# Patient Record
Sex: Male | Born: 1980 | Race: White | Smoking: Never smoker
Health system: Southern US, Community
[De-identification: ages and names within clinical notes are randomized; demographics above are authoritative.]

## PROBLEM LIST (undated history)

## (undated) DIAGNOSIS — T7840XA Allergy, unspecified, initial encounter: Secondary | ICD-10-CM

## (undated) DIAGNOSIS — J349 Unspecified disorder of nose and nasal sinuses: Secondary | ICD-10-CM

## (undated) DIAGNOSIS — I10 Essential (primary) hypertension: Secondary | ICD-10-CM

## (undated) HISTORY — DX: Unspecified disorder of nose and nasal sinuses: J34.9

## (undated) HISTORY — PX: TONSILLECTOMY: SUR1361

## (undated) HISTORY — PX: TYMPANOSTOMY TUBE PLACEMENT: SHX32

## (undated) HISTORY — DX: Allergy, unspecified, initial encounter: T78.40XA

## (undated) HISTORY — DX: Essential (primary) hypertension: I10

## (undated) HISTORY — PX: WISDOM TOOTH EXTRACTION: SHX21

---

## 2018-02-10 ENCOUNTER — Observation Stay
Admission: EM | Admit: 2018-02-10 | Discharge: 2018-02-12 | Disposition: A | Discharge status: Home / Self Care | Payer: 59 | Attending: Internal Medicine | Admitting: Internal Medicine

## 2018-02-10 ENCOUNTER — Encounter: Payer: Self-pay | Admitting: Physician Assistant

## 2018-02-10 ENCOUNTER — Observation Stay: Payer: 59

## 2018-02-10 ENCOUNTER — Encounter: Payer: Self-pay | Admitting: Emergency Medicine

## 2018-02-10 ENCOUNTER — Telehealth: Payer: Self-pay | Admitting: Physician Assistant

## 2018-02-10 ENCOUNTER — Ambulatory Visit (INDEPENDENT_AMBULATORY_CARE_PROVIDER_SITE_OTHER): Payer: PRIVATE HEALTH INSURANCE | Admitting: Physician Assistant

## 2018-02-10 VITALS — BP 220/140 | HR 113 | Temp 98.5°F | Ht 72.0 in | Wt 251.8 lb

## 2018-02-10 DIAGNOSIS — J309 Allergic rhinitis, unspecified: Secondary | ICD-10-CM | POA: Diagnosis not present

## 2018-02-10 DIAGNOSIS — I10 Essential (primary) hypertension: Secondary | ICD-10-CM | POA: Diagnosis not present

## 2018-02-10 DIAGNOSIS — Z6834 Body mass index (BMI) 34.0-34.9, adult: Secondary | ICD-10-CM | POA: Diagnosis not present

## 2018-02-10 DIAGNOSIS — E876 Hypokalemia: Secondary | ICD-10-CM | POA: Insufficient documentation

## 2018-02-10 DIAGNOSIS — I161 Hypertensive emergency: Secondary | ICD-10-CM

## 2018-02-10 DIAGNOSIS — R9431 Abnormal electrocardiogram [ECG] [EKG]: Secondary | ICD-10-CM | POA: Insufficient documentation

## 2018-02-10 DIAGNOSIS — I160 Hypertensive urgency: Secondary | ICD-10-CM | POA: Diagnosis not present

## 2018-02-10 DIAGNOSIS — D72829 Elevated white blood cell count, unspecified: Secondary | ICD-10-CM | POA: Insufficient documentation

## 2018-02-10 DIAGNOSIS — Z7982 Long term (current) use of aspirin: Secondary | ICD-10-CM | POA: Diagnosis not present

## 2018-02-10 DIAGNOSIS — R000 Tachycardia, unspecified: Secondary | ICD-10-CM | POA: Diagnosis not present

## 2018-02-10 DIAGNOSIS — E669 Obesity, unspecified: Secondary | ICD-10-CM | POA: Diagnosis not present

## 2018-02-10 DIAGNOSIS — Z79899 Other long term (current) drug therapy: Secondary | ICD-10-CM | POA: Insufficient documentation

## 2018-02-10 DIAGNOSIS — J329 Chronic sinusitis, unspecified: Secondary | ICD-10-CM | POA: Diagnosis present

## 2018-02-10 LAB — CBC
HEMATOCRIT: 49.1 % (ref 40.0–52.0)
HEMOGLOBIN: 16.7 g/dL (ref 13.0–18.0)
MCH: 27.3 pg (ref 26.0–34.0)
MCHC: 33.9 g/dL (ref 32.0–36.0)
MCV: 80.5 fL (ref 80.0–100.0)
Platelets: 330 10*3/uL (ref 150–440)
RBC: 6.1 MIL/uL — AB (ref 4.40–5.90)
RDW: 14.8 % — ABNORMAL HIGH (ref 11.5–14.5)
WBC: 15.1 10*3/uL — ABNORMAL HIGH (ref 3.8–10.6)

## 2018-02-10 LAB — BASIC METABOLIC PANEL
ANION GAP: 11 (ref 5–15)
BUN: 12 mg/dL (ref 6–20)
CHLORIDE: 103 mmol/L (ref 101–111)
CO2: 25 mmol/L (ref 22–32)
Calcium: 9.3 mg/dL (ref 8.9–10.3)
Creatinine, Ser: 1.11 mg/dL (ref 0.61–1.24)
GFR calc non Af Amer: >60 mL/min (ref >60)
Glucose, Bld: 125 mg/dL — ABNORMAL HIGH (ref 65–99)
Potassium: 3.2 mmol/L — ABNORMAL LOW (ref 3.5–5.1)
Sodium: 139 mmol/L (ref 135–145)

## 2018-02-10 LAB — TROPONIN I: Troponin I: <0.03 ng/mL (ref <0.03)

## 2018-02-10 MED ORDER — SODIUM CHLORIDE 0.9% FLUSH: 3.0000 mL | INTRAVENOUS | Status: DC | PRN

## 2018-02-10 MED ORDER — CLONIDINE HCL 0.1 MG PO TABS
0.1000 mg | ORAL_TABLET | ORAL | Status: AC
  Administered 2018-02-10: 0.1 mg via ORAL

## 2018-02-10 MED ORDER — ONDANSETRON HCL 4 MG/2ML IJ SOLN: 4.0000 mg | Freq: Four times a day (QID) | INTRAMUSCULAR | Status: DC | PRN

## 2018-02-10 MED ORDER — SALINE SPRAY 0.65 % NA SOLN
1.0000 | NASAL | Status: DC | PRN
  Filled 2018-02-10: qty 44

## 2018-02-10 MED ORDER — LABETALOL HCL 5 MG/ML IV SOLN
10.0000 mg | Freq: Once | INTRAVENOUS | Status: AC
  Administered 2018-02-10: 10 mg via INTRAVENOUS
  Filled 2018-02-10: qty 4

## 2018-02-10 MED ORDER — ATENOLOL 50 MG PO TABS
50.0000 mg | ORAL_TABLET | Freq: Every day | ORAL | Status: DC
  Administered 2018-02-11: 50 mg via ORAL
  Filled 2018-02-10 (×2): qty 1

## 2018-02-10 MED ORDER — POTASSIUM CHLORIDE CRYS ER 20 MEQ PO TBCR
40.0000 meq | EXTENDED_RELEASE_TABLET | Freq: Once | ORAL | Status: AC
  Administered 2018-02-10: 40 meq via ORAL
  Filled 2018-02-10: qty 2

## 2018-02-10 MED ORDER — ASPIRIN EC 81 MG PO TBEC
81.0000 mg | DELAYED_RELEASE_TABLET | Freq: Every day | ORAL | Status: DC
  Administered 2018-02-11 – 2018-02-12 (×2): 81 mg via ORAL
  Filled 2018-02-10 (×2): qty 1

## 2018-02-10 MED ORDER — ACETAMINOPHEN 650 MG RE SUPP: 650.0000 mg | Freq: Four times a day (QID) | RECTAL | Status: DC | PRN

## 2018-02-10 MED ORDER — CLONIDINE HCL 0.1 MG PO TABS
ORAL_TABLET | ORAL | Status: AC
  Administered 2018-02-10: 0.1 mg via ORAL
  Filled 2018-02-10: qty 1

## 2018-02-10 MED ORDER — ONDANSETRON HCL 4 MG PO TABS: 4.0000 mg | ORAL_TABLET | Freq: Four times a day (QID) | ORAL | Status: DC | PRN

## 2018-02-10 MED ORDER — HYDRALAZINE HCL 20 MG/ML IJ SOLN
10.0000 mg | INTRAMUSCULAR | Status: DC | PRN
  Administered 2018-02-10: 10 mg via INTRAVENOUS
  Filled 2018-02-10: qty 1

## 2018-02-10 MED ORDER — NITROGLYCERIN 0.4 MG SL SUBL: 0.4000 mg | SUBLINGUAL_TABLET | SUBLINGUAL | Status: DC | PRN

## 2018-02-10 MED ORDER — LABETALOL HCL 100 MG PO TABS
100.0000 mg | ORAL_TABLET | Freq: Once | ORAL | Status: AC
  Administered 2018-02-10: 100 mg via ORAL
  Filled 2018-02-10: qty 1

## 2018-02-10 MED ORDER — LORATADINE 10 MG PO TABS
10.0000 mg | ORAL_TABLET | Freq: Every day | ORAL | Status: DC
  Administered 2018-02-10 – 2018-02-11 (×2): 10 mg via ORAL
  Filled 2018-02-10 (×2): qty 1

## 2018-02-10 MED ORDER — FLUTICASONE PROPIONATE 50 MCG/ACT NA SUSP
2.0000 | Freq: Every day | NASAL | Status: DC
  Administered 2018-02-11 – 2018-02-12 (×2): 2 via NASAL
  Filled 2018-02-10: qty 16

## 2018-02-10 MED ORDER — SODIUM CHLORIDE 0.9% FLUSH
3.0000 mL | Freq: Two times a day (BID) | INTRAVENOUS | Status: DC
  Administered 2018-02-10 – 2018-02-12 (×4): 3 mL via INTRAVENOUS

## 2018-02-10 MED ORDER — MORPHINE SULFATE (PF) 2 MG/ML IV SOLN: 2.0000 mg | INTRAVENOUS | Status: DC | PRN

## 2018-02-10 MED ORDER — CLONIDINE HCL 0.1 MG PO TABS
0.1000 mg | ORAL_TABLET | ORAL | Status: AC
  Administered 2018-02-10: 0.1 mg via ORAL
  Filled 2018-02-10: qty 1

## 2018-02-10 MED ORDER — LISINOPRIL 20 MG PO TABS
20.0000 mg | ORAL_TABLET | Freq: Two times a day (BID) | ORAL | Status: DC
  Administered 2018-02-10 – 2018-02-12 (×4): 20 mg via ORAL
  Filled 2018-02-10 (×4): qty 1

## 2018-02-10 MED ORDER — HYDROCHLOROTHIAZIDE 25 MG PO TABS
25.0000 mg | ORAL_TABLET | Freq: Every day | ORAL | Status: DC
Start: 2018-02-10 — End: 2018-02-12
  Administered 2018-02-10 – 2018-02-12 (×3): 25 mg via ORAL
  Filled 2018-02-10 (×3): qty 1

## 2018-02-10 MED ORDER — SODIUM CHLORIDE 0.9 % IV SOLN: 250.0000 mL | INTRAVENOUS | Status: DC | PRN

## 2018-02-10 MED ORDER — ACETAMINOPHEN 325 MG PO TABS: 650.0000 mg | ORAL_TABLET | Freq: Four times a day (QID) | ORAL | Status: DC | PRN

## 2018-02-10 NOTE — H&P (Signed)
[facility] [place] [facility]   PATIENT NAME: Michael BjorkBryant Carr    MR#:  [id number]  DATE OF BIRTH:  05/02/1981  DATE OF ADMISSION:  02/10/2018  PRIMARY CARE PHYSICIAN: Trey SailorsPollak, Adriana M, PA-C   REQUESTING/REFERRING PHYSICIAN:   CHIEF COMPLAINT:   Chief Complaint  Patient presents with  . Hypertension    HISTORY OF PRESENT ILLNESS: Michael BjorkBryant Carr  is a 37 y.o. male with a known history per below, sent to the emergency room for abnormal EKG findings on routine follow-up for allergic rhinitis in the outpatient setting, patient was also noted to have systolic blood pressure greater than 240, in the emergency room patient was noted to have sinus tachycardia with heart rate up to 110, blood pressure 240/157, EKG noted for ST depression with T wave inversion laterally and possible anterior infarct of undetermined age, potassium 3.2, white count 15,000, patient evaluated in the emergency room, given clonidine without improvement, no apparent distress, patient denies feeling unwell, patient is now been admitted for acute hypertensive crisis with abnormal EKG findings.  PAST MEDICAL HISTORY:   Past Medical History:  Diagnosis Date  . Allergy   . Hypertension    previously per patient. Does not take any medications    PAST SURGICAL HISTORY:  Past Surgical History:  Procedure Laterality Date  . TONSILLECTOMY    . TYMPANOSTOMY TUBE PLACEMENT    . WISDOM TOOTH EXTRACTION      SOCIAL HISTORY:  Social History   Tobacco Use  . Smoking status: Never Smoker  . Smokeless tobacco: Never Used  Substance Use Topics  . Alcohol use: Yes    Comment: socially    FAMILY HISTORY:  Family History  Problem Relation Age of Onset  . Heart disease Mother   . Lung cancer Father   . Hemachromatosis Father   . Drug abuse Brother   . Alcohol abuse Brother   . Heart attack Brother   . Epilepsy Brother   . Cancer Maternal Grandfather   . Alzheimer's disease Maternal Grandfather   .  Hemachromatosis Paternal Grandfather   . Liver disease Paternal Grandfather     DRUG ALLERGIES: No Known Allergies  REVIEW OF SYSTEMS:   CONSTITUTIONAL: No fever, fatigue or weakness.  EYES: No blurred or double vision.  EARS, NOSE, AND THROAT: No tinnitus or ear pain. + Chronic allergies RESPIRATORY: No cough, shortness of breath, wheezing or hemoptysis.  CARDIOVASCULAR: No chest pain, orthopnea, edema.  GASTROINTESTINAL: No nausea, vomiting, diarrhea or abdominal pain.  GENITOURINARY: No dysuria, hematuria.  ENDOCRINE: No polyuria, nocturia,  HEMATOLOGY: No anemia, easy bruising or bleeding SKIN: No rash or lesion. MUSCULOSKELETAL: No joint pain or arthritis.   NEUROLOGIC: No tingling, numbness, weakness.  PSYCHIATRY: No anxiety or depression.   MEDICATIONS AT HOME:  Prior to Admission medications   Not on File      PHYSICAL EXAMINATION:   VITAL SIGNS: Blood pressure (!) 241/145, pulse 99, temperature 98.3 F (36.8 C), temperature source Oral, resp. rate 18, height 6' (1.829 m), weight 113.9 kg (251 lb), SpO2 99 %.  GENERAL:  37 y.o.-year-old patient lying in the bed with no acute distress.  Obese EYES: Pupils equal, round, reactive to light and accommodation. No scleral icterus. Extraocular muscles intact.  HEENT: Head atraumatic, normocephalic. Oropharynx and nasopharynx clear.  NECK:  Supple, no jugular venous distention. No thyroid enlargement, no tenderness.  LUNGS: Normal breath sounds bilaterally, no wheezing, rales,rhonchi or crepitation. No use of accessory muscles of respiration.  CARDIOVASCULAR:  S1, S2 normal. No murmurs, rubs, or gallops.  ABDOMEN: Soft, nontender, nondistended. Bowel sounds present. No organomegaly or mass.  EXTREMITIES: No pedal edema, cyanosis, or clubbing.  NEUROLOGIC: Cranial nerves II through XII are intact. Muscle strength 5/5 in all extremities. Sensation intact. Gait not checked.  PSYCHIATRIC: The patient is alert and oriented x 3.   SKIN: No obvious rash, lesion, or ulcer.   LABORATORY PANEL:   CBC Recent Labs  Lab 02/10/18 1258  WBC 15.1*  HGB 16.7  HCT 49.1  PLT 330  MCV 80.5  MCH 27.3  MCHC 33.9  RDW 14.8*   ------------------------------------------------------------------------------------------------------------------  Chemistries  Recent Labs  Lab 02/10/18 1258  NA 139  K 3.2*  CL 103  CO2 25  GLUCOSE 125*  BUN 12  CREATININE 1.11  CALCIUM 9.3   ------------------------------------------------------------------------------------------------------------------ estimated creatinine clearance is 118.7 mL/min (by C-G formula based on SCr of 1.11 mg/dL). ------------------------------------------------------------------------------------------------------------------ No results for input(s): TSH, T4TOTAL, T3FREE, THYROIDAB in the last 72 hours.  Invalid input(s): FREET3   Coagulation profile No results for input(s): INR, PROTIME in the last 168 hours. ------------------------------------------------------------------------------------------------------------------- No results for input(s): DDIMER in the last 72 hours. -------------------------------------------------------------------------------------------------------------------  Cardiac Enzymes Recent Labs  Lab 02/10/18 1258  TROPONINI <0.03   ------------------------------------------------------------------------------------------------------------------ Invalid input(s): POCBNP  ---------------------------------------------------------------------------------------------------------------  Urinalysis No results found for: COLORURINE, APPEARANCEUR, LABSPEC, PHURINE, GLUCOSEU, HGBUR, BILIRUBINUR, KETONESUR, PROTEINUR, UROBILINOGEN, NITRITE, LEUKOCYTESUR   RADIOLOGY: No results found.  EKG: Orders placed or performed during the hospital encounter of 02/10/18  . ED EKG  . ED EKG    IMPRESSION AND PLAN: *Acute  hypertensive crisis Referred to the observation unit on the telemetry floor, start atenolol daily, lisinopril twice daily, hydrochlorothiazide, hydralazine as needed systolic blood pressure greater than 160, vitals per routine, make changes as per necessary  *Acute abnormal EKG findings Without signs or symptoms of ACS Noted for ST segment depression with T wave inversions laterally, ? anterior infarct of undetermined age Cardiology to see, aspirin daily, nitrates as needed, IV morphine PRN severe pain, supplemental oxygen, check echocardiogram, continue close medical monitoring  *Acute hypokalemia Check magnesium level and replete with p.o. Potassium Check BMP in the morning  *Chronic allergic rhinitis Flonase daily, claritin, nasal saline  *Acute sinus tachycardia Etiology is unknown Noted leukocytosis Check chest x-ray, urine drug screen, urinalysis  *Acute leukocytosis Exact etiology unknown ?  Secondary to sinus disease Avoid antibiotics for now, routine allergy medicines per above, repeat CBC in the morning  *Chronic obesity Most likely secondary to excess calories Lifestyle modification recommended  All the records are reviewed and case discussed with ED provider. Management plans discussed with the patient, family and they are in agreement.  CODE STATUS:full    TOTAL TIME TAKING CARE OF THIS PATIENT: 45 minutes.    Evelena Asa Salary M.D on 02/10/2018   Between 7am to 6pm - Pager - [phone]  After 6pm go to [email or web address] - password [email or web address] [facility]  [facility] [place] [facility]  Office  [phone]  CC: Primary care physician; Trey Sailors, PA-C   Note: This dictation was prepared with Dragon dictation along with smaller phrase technology. Any transcriptional errors that result from this process are unintentional.

## 2018-02-10 NOTE — Telephone Encounter (Signed)
Patient presents in clinic today as a new patient today. On arrival, his BP was 240/188. He reports feeling perfectly normal. He denies chest pain, SOB, blurred vision, headache. He initially does not want to go to the ER because of work appointments. On recheck his BP is 240/140 and patient remains asymptomatic. EKG does not show ST elevations but does show marked LVH and inverted T-waves in V1. At this point, patients blood pressure remains dangerously elevated and he has been instructed to go to the ER. Since he is not having chest pain or otherwise symptomatic, he has been advised that he can drive himself to [facility] and he MUST go to [facility] ER. He has promised to do that. I have called ahead and given report to Penni BombardKendall, Charity fundraiserN.

## 2018-02-10 NOTE — ED Provider Notes (Signed)
[facility] Emergency Department Provider Note ____________________________________________   First MD Initiated Contact with Patient 02/10/18 1530     (approximate)  I have reviewed the triage vital signs and the nursing notes.   HISTORY  Chief Complaint Hypertension   HPI Michael Carr is a 37 y.o. male presents for evaluation for elevated blood pressure  Patient reports that her primary care doctor clinic appointment today for a sinus infection which she gets off and on seasonally.  Reports are usually treated with azithromycin, but today notices blood pressure was over 200.  They referred him here for further treatment  Reports he had blood pressure problems 5 years ago in [place], he was on lisinopril at that time but is since discontinued after he had weight loss.  As in his blood pressure checked in several years he suspects  Denies chest pain no vision changes no headache.  Reports he would have had no idea his blood pressure was high for him checked to the clinic.  No fevers or chills.  No history of any aneurysm.  No known heart problems.   Past Medical History:  Diagnosis Date  . Allergy   . Hypertension    previously per patient. Does not take any medications    There are no active problems to display for this patient.   Past Surgical History:  Procedure Laterality Date  . TONSILLECTOMY    . TYMPANOSTOMY TUBE PLACEMENT    . WISDOM TOOTH EXTRACTION      Prior to Admission medications   Not on File    Allergies Patient has no known allergies.  Family History  Problem Relation Age of Onset  . Heart disease Mother   . Lung cancer Father   . Hemachromatosis Father   . Drug abuse Brother   . Alcohol abuse Brother   . Heart attack Brother   . Epilepsy Brother   . Cancer Maternal Grandfather   . Alzheimer's disease Maternal Grandfather   . Hemachromatosis Paternal Grandfather   . Liver disease Paternal Grandfather      Social History Social History   Tobacco Use  . Smoking status: Never Smoker  . Smokeless tobacco: Never Used  Substance Use Topics  . Alcohol use: Yes    Comment: socially  . Drug use: Never    Review of Systems Constitutional: No fever/chills Eyes: No visual changes. ENT: No sore throat.  Slight nasal congestion, some sinus pressure over the left side of the maxillary sinus for about a week. Cardiovascular: Denies chest pain. Respiratory: Denies shortness of breath. Gastrointestinal: No abdominal pain.  No nausea, no vomiting.  No diarrhea.  No constipation. Genitourinary: Negative for dysuria. Musculoskeletal: Negative for back pain. Skin: Negative for rash. Neurological: Negative for headaches, focal weakness or numbness.    ____________________________________________   PHYSICAL EXAM:  VITAL SIGNS: ED Triage Vitals  Enc Vitals Group     BP 02/10/18 1249 (!) 240/113     Pulse Rate 02/10/18 1249 (!) 110     Resp 02/10/18 1249 20     Temp 02/10/18 1249 98.3 F (36.8 C)     Temp Source 02/10/18 1249 Oral     SpO2 02/10/18 1249 97 %     Weight 02/10/18 1242 251 lb (113.9 kg)     Height 02/10/18 1242 6' (1.829 m)     Head Circumference --      Peak Flow --      Pain Score 02/10/18 1242 0  Pain Loc --      Pain Edu? --      Excl. in GC? --     Constitutional: Alert and oriented. Well appearing and in no acute distress. Eyes: Conjunctivae are normal. Head: Atraumatic. Nose: Some mild left-sided nasal congestion.  Mild tenderness of the left-sided sinus maxillary. Mouth/Throat: Mucous membranes are moist. Neck: No stridor.   Cardiovascular: Normal rate, regular rhythm. Grossly normal heart sounds.  Good peripheral circulation. Respiratory: Normal respiratory effort.  No retractions. Lungs CTAB. Gastrointestinal: Soft and nontender. No distention. Musculoskeletal: No lower extremity tenderness nor edema. Neurologic:  Normal speech and language. No gross  focal neurologic deficits are appreciated.  Skin:  Skin is warm, dry and intact. No rash noted. Psychiatric: Mood and affect are normal. Speech and behavior are normal.  ____________________________________________   LABS (all labs ordered are listed, but only abnormal results are displayed)  Labs Reviewed  BASIC METABOLIC PANEL - Abnormal; Notable for the following components:      Result Value   Potassium 3.2 (*)    Glucose, Bld 125 (*)    All other components within normal limits  CBC - Abnormal; Notable for the following components:   WBC 15.1 (*)    RBC 6.10 (*)    RDW 14.8 (*)    All other components within normal limits  TROPONIN I   ____________________________________________  EKG  Reviewed and interpreted by me at 1300 Heart rate 100 QRS 100 QTc 480 Sinus tachycardia, repolarization abnormality. ____________________________________________  RADIOLOGY   ____________________________________________   PROCEDURES  Procedure(s) performed: None  Procedures  Critical Care performed: No  ____________________________________________   INITIAL IMPRESSION / ASSESSMENT AND PLAN / ED COURSE  Pertinent labs & imaging results that were available during my care of the patient were reviewed by me and considered in my medical decision making (see chart for details).  Patient presents for evaluation of hypertension.  He has notable hypertension, but is asymptomatic.  We will treat his asymptomatic hypertension.  EKG shows evidence of probable LVH with repolarization.  No chest pain.  Normal troponin.  No evidence of ACS.  Discussed risks and benefits of multiple blood pressure medications with the patient, after discussion elected to use clonidine.  Will give first dose here and monitor for improvement.  Patient reports available follow-up closely with primary care doctor within the next week, monitor his blood pressures, and be compliant with  prescriptions.  Condition, plan to give prescription for Z-Pak, patient reports he went to the doctor for a sinus infection today but was not able to have that fully addressed due to blood pressure issue.    ----------------------------------------- 7:12 PM on 02/10/2018 -----------------------------------------  Patient will be admitted for further work-up.  After 2 doses of clonidine, patient continued to have severe refractory hypertension.  He remains asymptomatic, will give a small dose of IV labetalol and attempt oral labetalol at this time.  Blood pressure remains 230/129, heart rate 105.  He is alert and mentating.  He is understanding agreeable with plan for admission.  Discussed case with Dr. Katheren Shams  ____________________________________________   FINAL CLINICAL IMPRESSION(S) / ED DIAGNOSES  Final diagnoses:  Hypertensive emergency, no CHF  Sinusitis, unspecified chronicity, unspecified location      NEW MEDICATIONS STARTED DURING THIS VISIT:  New Prescriptions   No medications on file     Note:  This document was prepared using Dragon voice recognition software and may include unintentional dictation errors.     Sharyn Creamer, MD  02/10/18 1912  

## 2018-02-10 NOTE — ED Notes (Signed)
Vanessa RN, aware of bed assigned  

## 2018-02-10 NOTE — ED Triage Notes (Signed)
Pt comes into the ED via POV c/o HTN.  Patient was sent by [facility].  Patient has no known history of HTN.  Denies any chest pain, shortness of breath, weakness, dizziness, or headaches.  Patient in NAD at this time.

## 2018-02-10 NOTE — Progress Notes (Signed)
Family Meeting Note  Advance Directive:yes  Today a meeting took place with the Patient.  Patient is able to participate   The following clinical team members were present during this meeting:MD  The following were discussed:Patient's diagnosis: Abnormal EKG, obesity, hypertensive crisis, allergic rhinitis, Patient's progosis: Unable to determine and Goals for treatment: Full Code  Additional follow-up to be provided: prn  Time spent during discussion:20 minutes  Bertrum SolMontell D Salary, MD

## 2018-02-10 NOTE — ED Notes (Signed)
First RN note:  Patient sent by [facility] for HT[facility].  Patient had readings of 240/140.  Denies chest pain, shortness of breath, dizziness, blurred vision, or headaches.  Patient in NAD.

## 2018-02-10 NOTE — Patient Instructions (Signed)

## 2018-02-10 NOTE — Progress Notes (Addendum)
Patient: Michael Carr [place] Male    DOB: 11/07/1980   37 y.o.   MRN: [id number] Visit Date: 02/10/2018  Today's Provider: Trey SailorsAdriana M Pollak, PA-C   Chief Complaint  Patient presents with  . Establish Care   Subjective:    HPI Michael Carr Buehrer is a 37 year old male who presents today to Establish Care as a new patient. He is previously a patient of Dr. Amaryllis DykeSharon Lechner at [facility] in [place]. [place] from [place]. He is a Firefighterfinancial advisor.   He reports having had a history of high blood pressure but never having been treated for it. His BP is very elevated today, 240 over 180 on initial check, then 240/140 on recheck. He denies chest pain, SOB, blurred vision, headaches. He reports brother had MI at 5333: brother abuses drugs including cocaine and alcohol.     No Known Allergies  No current outpatient medications on file.   Review of Systems  Constitutional: Negative.   HENT: Positive for congestion and sinus pain.   Eyes: Negative.   Respiratory: Positive for cough.   Cardiovascular: Negative.   Gastrointestinal: Negative.   Endocrine: Negative.   Genitourinary: Negative.   Musculoskeletal: Negative.   Skin: Negative.   Allergic/Immunologic: Positive for environmental allergies.  Neurological: Negative.   Hematological: Negative.   Psychiatric/Behavioral: Negative.      Social History   Tobacco Use  . Smoking status: Never Smoker  . Smokeless tobacco: Never Used  Substance Use Topics  . Alcohol use: Yes    Comment: socially   Objective:   BP (!) 220/140 (BP Location: Left Arm, Patient Position: Sitting, Cuff Size: Large)   Pulse (!) 113   Temp 98.5 F (36.9 C) (Oral)   Ht 6' (1.829 m)   Wt 251 lb 12.8 oz (114.2 kg)   SpO2 97%   BMI 34.15 kg/m    Physical Exam  Constitutional: He is oriented to person, place, and time. He appears well-developed and well-nourished.  Cardiovascular: Regular rhythm. Tachycardia present.  Pulmonary/Chest:  Effort normal and breath sounds normal.  Neurological: He is alert and oriented to person, place, and time.  Skin: Skin is warm and dry.  Psychiatric: He has a normal mood and affect. His behavior is normal.        Assessment & Plan:     1. Hypertensive urgency  Patient's blood pressure is extremely elevated in our clinic. He denies symptoms currently, says he is at his baseline if not a little nervous. His EKG shows marked LVH, some inferior T-waves in V1. Also some peaked T-waves V1-V3.  I suspect from degree of LVH on his EKG that he has had untreated hypertension for quite some time. At this point, will direct him to the ER for emergent workup and treatment. Will need to have him back to manage his blood pressure and for further evaluation.  Have directed [facility] to [facility] ER. Have called ahead and given report.  - EKG 12-Lead  2. Tachycardia   Return in about 1 week (around 02/17/2018) for HTN.  The entirety of the information documented in the History of Present Illness, Review of Systems and Physical Exam were personally obtained by me. Portions of this information were initially documented by Kavin LeechLaura Walsh, CMA and reviewed by me for thoroughness and accuracy.   I have spent 45 minutes with this patient, >50% of which was spent on counseling and coordination of care.  Trinna Post, PA-C  [facility]

## 2018-02-10 NOTE — ED Notes (Signed)
Informed RN that patient has been roomed and is ready for evaluation.  Patient in NAD at this time and call bell placed within reach.   

## 2018-02-11 ENCOUNTER — Other Ambulatory Visit: Payer: Self-pay

## 2018-02-11 ENCOUNTER — Other Ambulatory Visit: Payer: Self-pay | Admitting: Family Medicine

## 2018-02-11 ENCOUNTER — Observation Stay
Admit: 2018-02-11 | Discharge: 2018-02-11 | Disposition: A | Discharge status: Home / Self Care | Payer: 59 | Attending: Family Medicine | Admitting: Family Medicine

## 2018-02-11 LAB — BASIC METABOLIC PANEL
ANION GAP: 9 (ref 5–15)
BUN: 15 mg/dL (ref 6–20)
CHLORIDE: 106 mmol/L (ref 101–111)
CO2: 27 mmol/L (ref 22–32)
Calcium: 9.1 mg/dL (ref 8.9–10.3)
Creatinine, Ser: 1.04 mg/dL (ref 0.61–1.24)
GFR calc Af Amer: >60 mL/min (ref >60)
GLUCOSE: 106 mg/dL — AB (ref 65–99)
POTASSIUM: 3.6 mmol/L (ref 3.5–5.1)
Sodium: 142 mmol/L (ref 135–145)

## 2018-02-11 LAB — URINALYSIS, COMPLETE (UACMP) WITH MICROSCOPIC
BACTERIA UA: NONE SEEN
Bilirubin Urine: NEGATIVE
GLUCOSE, UA: NEGATIVE mg/dL
HGB URINE DIPSTICK: NEGATIVE
Ketones, ur: NEGATIVE mg/dL
Leukocytes, UA: NEGATIVE
NITRITE: NEGATIVE
PROTEIN: NEGATIVE mg/dL
Specific Gravity, Urine: 1.02 (ref 1.005–1.030)
Squamous Epithelial / LPF: NONE SEEN (ref 0–5)
pH: 5 (ref 5.0–8.0)

## 2018-02-11 LAB — LIPID PANEL
CHOL/HDL RATIO: 7.3 ratio
CHOLESTEROL: 153 mg/dL (ref 0–200)
HDL: 21 mg/dL — AB (ref >40)
LDL Cholesterol: 97 mg/dL (ref 0–99)
TRIGLYCERIDES: 177 mg/dL — AB (ref <150)
VLDL: 35 mg/dL (ref 0–40)

## 2018-02-11 LAB — CBC WITH DIFFERENTIAL/PLATELET
BASOS ABS: 0.1 10*3/uL (ref 0–0.1)
Basophils Relative: 1 %
EOS PCT: 3 %
Eosinophils Absolute: 0.3 10*3/uL (ref 0–0.7)
HCT: 46.4 % (ref 40.0–52.0)
Hemoglobin: 15.7 g/dL (ref 13.0–18.0)
LYMPHS PCT: 34 %
Lymphs Abs: 4.3 10*3/uL — ABNORMAL HIGH (ref 1.0–3.6)
MCH: 27.5 pg (ref 26.0–34.0)
MCHC: 33.8 g/dL (ref 32.0–36.0)
MCV: 81.2 fL (ref 80.0–100.0)
Monocytes Absolute: 0.8 10*3/uL (ref 0.2–1.0)
Monocytes Relative: 7 %
Neutro Abs: 7.1 10*3/uL — ABNORMAL HIGH (ref 1.4–6.5)
Neutrophils Relative %: 55 %
PLATELETS: 305 10*3/uL (ref 150–440)
RBC: 5.71 MIL/uL (ref 4.40–5.90)
RDW: 15.1 % — AB (ref 11.5–14.5)
WBC: 12.7 10*3/uL — ABNORMAL HIGH (ref 3.8–10.6)

## 2018-02-11 LAB — URINE DRUG SCREEN, QUALITATIVE (ARMC ONLY)
Amphetamines, Ur Screen: NOT DETECTED
Barbiturates, Ur Screen: NOT DETECTED
Benzodiazepine, Ur Scrn: NOT DETECTED
CANNABINOID 50 NG, UR ~~LOC~~: NOT DETECTED
COCAINE METABOLITE, UR ~~LOC~~: NOT DETECTED
MDMA (ECSTASY) UR SCREEN: NOT DETECTED
Methadone Scn, Ur: NOT DETECTED
OPIATE, UR SCREEN: NOT DETECTED
PHENCYCLIDINE (PCP) UR S: NOT DETECTED
Tricyclic, Ur Screen: NOT DETECTED

## 2018-02-11 LAB — HEMOGLOBIN A1C
Hgb A1c MFr Bld: 5.5 % (ref 4.8–5.6)
Mean Plasma Glucose: 111.15 mg/dL

## 2018-02-11 LAB — TROPONIN I
Troponin I: <0.03 ng/mL (ref <0.03)
Troponin I: <0.03 ng/mL (ref <0.03)
Troponin I: <0.03 ng/mL (ref <0.03)

## 2018-02-11 LAB — ECHOCARDIOGRAM COMPLETE
Height: 72 in
Weight: 4016 oz

## 2018-02-11 LAB — MAGNESIUM: Magnesium: 2.1 mg/dL (ref 1.7–2.4)

## 2018-02-11 LAB — TSH: TSH: 2.663 u[IU]/mL (ref 0.350–4.500)

## 2018-02-11 MED ORDER — ENOXAPARIN SODIUM 40 MG/0.4ML ~~LOC~~ SOLN
40.0000 mg | SUBCUTANEOUS | Status: DC
  Administered 2018-02-11: 40 mg via SUBCUTANEOUS
  Filled 2018-02-11: qty 0.4

## 2018-02-11 MED ORDER — AMLODIPINE BESYLATE 10 MG PO TABS
10.0000 mg | ORAL_TABLET | Freq: Every day | ORAL | Status: DC
  Administered 2018-02-11 – 2018-02-12 (×2): 10 mg via ORAL
  Filled 2018-02-11 (×2): qty 1

## 2018-02-11 MED ORDER — AZITHROMYCIN 250 MG PO TABS
500.0000 mg | ORAL_TABLET | Freq: Every day | ORAL | Status: DC
  Administered 2018-02-11 – 2018-02-12 (×2): 500 mg via ORAL
  Filled 2018-02-11 (×2): qty 2

## 2018-02-11 MED ORDER — CLONIDINE HCL 0.1 MG PO TABS
0.1000 mg | ORAL_TABLET | Freq: Two times a day (BID) | ORAL | Status: DC
  Administered 2018-02-11 – 2018-02-12 (×3): 0.1 mg via ORAL
  Filled 2018-02-11 (×3): qty 1

## 2018-02-11 MED ORDER — LABETALOL HCL 200 MG PO TABS
300.0000 mg | ORAL_TABLET | Freq: Three times a day (TID) | ORAL | Status: DC
  Administered 2018-02-11 – 2018-02-12 (×4): 300 mg via ORAL
  Filled 2018-02-11 (×6): qty 1.5

## 2018-02-11 MED ORDER — LORATADINE 10 MG PO TABS
10.0000 mg | ORAL_TABLET | Freq: Every day | ORAL | Status: DC
  Administered 2018-02-11 – 2018-02-12 (×2): 10 mg via ORAL
  Filled 2018-02-11: qty 1

## 2018-02-11 NOTE — Progress Notes (Signed)
[employer] - [place] at [facility]                                                                                                                                                                                  Patient Demographics   Michael Carr, is a 37 y.o. male, DOB - 1981/08/08, ZOX:[id number]  Admit date - 02/10/2018   Admitting Physician Bertrum Sol, MD  Outpatient Primary MD for the patient is Trey Sailors, PA-C   LOS - 0  Subjective: Patient admitted with accelerated hypertension blood pressure continues to be elevated patient currently asymptomatic Complains of sinus congestion and cough On further questioning patient has been using Sudafed at home for allergies and nasal congestion   Review of Systems:   CONSTITUTIONAL: No documented fever. No fatigue, weakness. No weight gain, no weight loss.  EYES: No blurry or double vision.  ENT: No tinnitus. +postnasal drip. No redness of the oropharynx.  RESPIRATORY: Positive cough, no wheeze, no hemoptysis. No dyspnea.  CARDIOVASCULAR: No chest pain. No orthopnea. No palpitations. No syncope.  GASTROINTESTINAL: No nausea, no vomiting or diarrhea. No abdominal pain. No melena or hematochezia.  GENITOURINARY: No dysuria or hematuria.  ENDOCRINE: No polyuria or nocturia. No heat or cold intolerance.  HEMATOLOGY: No anemia. No bruising. No bleeding.  INTEGUMENTARY: No rashes. No lesions.  MUSCULOSKELETAL: No arthritis. No swelling. No gout.  NEUROLOGIC: No numbness, tingling, or ataxia. No seizure-type activity.  PSYCHIATRIC: No anxiety. No insomnia. No ADD.    Vitals:   Vitals:   02/11/18 0353 02/11/18 0746 02/11/18 1042 02/11/18 1243  BP: (!) 182/126 (!) 194/116 (!) 193/128 (!) 158/110  Pulse: 80 83 92   Resp: 18 18    Temp: 98.1 F (36.7 C) 98.4 F (36.9 C)    TempSrc: Oral Oral    SpO2: 98% 98%    Weight:      Height:        Wt Readings from Last 3 Encounters:  02/10/18 113.9 kg (251 lb)   02/10/18 114.2 kg (251 lb 12.8 oz)     Intake/Output Summary (Last 24 hours) at 02/11/2018 1456 Last data filed at 02/11/2018 1345 Gross per 24 hour  Intake 600 ml  Output 1000 ml  Net -400 ml    Physical Exam:   GENERAL: Pleasant-appearing in no apparent distress.  HEAD, EYES, EARS, NOSE AND THROAT: Atraumatic, normocephalic. Extraocular muscles are intact. Pupils equal and reactive to light. Sclerae anicteric. No conjunctival injection. No oro-pharyngeal erythema.  NECK: Supple. There is no jugular venous distention. No bruits, no lymphadenopathy, no thyromegaly.  HEART: Regular rate and rhythm,. No murmurs, no rubs, no clicks.  LUNGS:  Clear to auscultation bilaterally. No rales or rhonchi. No wheezes.  ABDOMEN: Soft, flat, nontender, nondistended. Has good bowel sounds. No hepatosplenomegaly appreciated.  EXTREMITIES: No evidence of any cyanosis, clubbing, or peripheral edema.  +2 pedal and radial pulses bilaterally.  NEUROLOGIC: The patient is alert, awake, and oriented x3 with no focal motor or sensory deficits appreciated bilaterally.  SKIN: Moist and warm with no rashes appreciated.  Psych: Not anxious, depressed LN: No inguinal LN enlargement    Antibiotics   Anti-infectives (From admission, onward)   Start     Dose/Rate Route Frequency Ordered Stop   02/11/18 1000  azithromycin (ZITHROMAX) tablet 500 mg     500 mg Oral Daily 02/11/18 0845 02/16/18 0959      Medications   Scheduled Meds: . amLODipine  10 mg Oral Daily  . aspirin EC  81 mg Oral Daily  . azithromycin  500 mg Oral Daily  . cloNIDine  0.1 mg Oral BID  . enoxaparin (LOVENOX) injection  40 mg Subcutaneous Q24H  . fluticasone  2 spray Each Nare Daily  . hydrochlorothiazide  25 mg Oral Daily  . labetalol  300 mg Oral TID  . lisinopril  20 mg Oral BID  . loratadine  10 mg Oral Daily  . sodium chloride flush  3 mL Intravenous Q12H   Continuous Infusions: . sodium chloride     PRN Meds:.sodium  chloride, acetaminophen **OR** acetaminophen, hydrALAZINE, morphine injection, nitroGLYCERIN, ondansetron **OR** ondansetron (ZOFRAN) IV, sodium chloride, sodium chloride flush   Data Review:   Micro Results No results found for this or any previous visit (from the past 240 hour(s)).  Radiology Reports Dg Chest Port 1 View  Result Date: 02/10/2018 CLINICAL DATA:  Acute onset of leukocytosis. EXAM: PORTABLE CHEST 1 VIEW COMPARISON:  None. FINDINGS: The lungs are well-aerated and clear. There is no evidence of focal opacification, pleural effusion or pneumothorax. The cardiomediastinal silhouette is within normal limits. No acute osseous abnormalities are seen. IMPRESSION: No acute cardiopulmonary process seen. Electronically Signed   By: Roanna Raider M.D.   On: 02/10/2018 22:23     CBC Recent Labs  Lab 02/10/18 1258 02/11/18 0442  WBC 15.1* 12.7*  HGB 16.7 15.7  HCT 49.1 46.4  PLT 330 305  MCV 80.5 81.2  MCH 27.3 27.5  MCHC 33.9 33.8  RDW 14.8* 15.1*  LYMPHSABS  --  4.3*  MONOABS  --  0.8  EOSABS  --  0.3  BASOSABS  --  0.1    Chemistries  Recent Labs  Lab 02/10/18 1258 02/10/18 2328 02/11/18 0442  NA 139  --  142  K 3.2*  --  3.6  CL 103  --  106  CO2 25  --  27  GLUCOSE 125*  --  106*  BUN 12  --  15  CREATININE 1.11  --  1.04  CALCIUM 9.3  --  9.1  MG  --  2.1  --    ------------------------------------------------------------------------------------------------------------------ estimated creatinine clearance is 126.7 mL/min (by C-G formula based on SCr of 1.04 mg/dL). ------------------------------------------------------------------------------------------------------------------ Recent Labs    02/11/18 1045  HGBA1C 5.5   ------------------------------------------------------------------------------------------------------------------ Recent Labs    02/11/18 1045  CHOL 153  HDL 21*  LDLCALC 97  TRIG 696*  CHOLHDL 7.3    ------------------------------------------------------------------------------------------------------------------ Recent Labs    02/10/18 2328  TSH 2.663   ------------------------------------------------------------------------------------------------------------------ No results for input(s): VITAMINB12, FOLATE, FERRITIN, TIBC, IRON, RETICCTPCT in the last 72 hours.  Coagulation profile No results for input(s):  INR, PROTIME in the last 168 hours.  No results for input(s): DDIMER in the last 72 hours.  Cardiac Enzymes Recent Labs  Lab 02/10/18 2328 02/11/18 0442 02/11/18 1045  TROPONINI <0.03 <0.03 <0.03   ------------------------------------------------------------------------------------------------------------------ Invalid input(s): POCBNP    Assessment & Plan   *Acute hypertensive crisis Patient's blood pressure continues to be significantly elevated I have started the patient on amlodipine, clonidine, labetalol and HCTZ Use PRN IV hydralazine   *Acute abnormal EKG findings Without signs or symptoms of ACS This is related to accelerated hypertension Outpatient cardiology follow-up  *Acute hypokalemia Replaced   *Chronic allergic rhinitis Start patient on Flonase Claritin Also has sinusitis we will treat with oral azithromycin  *Acute sinus tachycardia Resolved  *Acute leukocytosis Due to sinusitis WBC improved  *Chronic obesity Most likely secondary to excess calories Lifestyle modification recommended        Code Status Orders  (From admission, onward)        Start     Ordered   02/10/18 2256  Full code  Continuous     02/10/18 2256    Code Status History    This patient has a current code status but no historical code status.           Consults cardiology  DVT Prophylaxis  Lovenox  Lab Results  Component Value Date   PLT 305 02/11/2018     Time Spent in minutes   35 minutes greater than 50% of time spent in  care coordination and counseling patient regarding the condition and plan of care.   Auburn BilberryShreyang Fritz Cauthon M.D on 02/11/2018 at 2:56 PM  Between 7am to 6pm - Pager - [phone]  After 6pm go to [email or web address] - Social research officer, governmentpassword [email or web address]  [employer]   Office  [phone]

## 2018-02-11 NOTE — Consult Note (Signed)
Dillon BjorkBryant Otte is a 37 y.o. male  [id number]  Primary Cardiologist: Adrian BlackwaterShaukat Allayna Erlich Reason for Consultation: uncontrolled hypertension with hypertensive crisis  HPI: this is a 37 year old white male took some decongestants and blood pressure went up to as high as 260/150. He had some headache but no chest pain or shortness of breath.   Review of Systems: no chest pain no dizziness or headache at this time   Past Medical History:  Diagnosis Date  . Allergy   . Hypertension    previously per patient. Does not take any medications    No medications prior to admission.     Marland Kitchen. amLODipine  10 mg Oral Daily  . aspirin EC  81 mg Oral Daily  . azithromycin  500 mg Oral Daily  . fluticasone  2 spray Each Nare Daily  . hydrochlorothiazide  25 mg Oral Daily  . labetalol  300 mg Oral TID  . lisinopril  20 mg Oral BID  . loratadine  10 mg Oral Daily  . sodium chloride flush  3 mL Intravenous Q12H    Infusions: . sodium chloride      No Known Allergies  Social History   Socioeconomic History  . Marital status: Divorced    Spouse name: Not on file  . Number of children: Not on file  . Years of education: Not on file  . Highest education level: Not on file  Occupational History  . Not on file  Social Needs  . Financial resource strain: Not on file  . Food insecurity:    Worry: Not on file    Inability: Not on file  . Transportation needs:    Medical: Not on file    Non-medical: Not on file  Tobacco Use  . Smoking status: Never Smoker  . Smokeless tobacco: Never Used  Substance and Sexual Activity  . Alcohol use: Yes    Comment: socially  . Drug use: Never  . Sexual activity: Yes  Lifestyle  . Physical activity:    Days per week: Not on file    Minutes per session: Not on file  . Stress: Not on file  Relationships  . Social connections:    Talks on phone: Not on file    Gets together: Not on file    Attends religious service: Not on file    Active member of club  or organization: Not on file    Attends meetings of clubs or organizations: Not on file    Relationship status: Not on file  . Intimate partner violence:    Fear of current or ex partner: Not on file    Emotionally abused: Not on file    Physically abused: Not on file    Forced sexual activity: Not on file  Other Topics Concern  . Not on file  Social History Narrative  . Not on file    Family History  Problem Relation Age of Onset  . Heart disease Mother   . Lung cancer Father   . Hemachromatosis Father   . Drug abuse Brother   . Alcohol abuse Brother   . Heart attack Brother   . Epilepsy Brother   . Cancer Maternal Grandfather   . Alzheimer's disease Maternal Grandfather   . Hemachromatosis Paternal Grandfather   . Liver disease Paternal Grandfather     PHYSICAL EXAM: Vitals:   02/11/18 0353 02/11/18 0746  BP: (!) 182/126 (!) 194/116  Pulse: 80 83  Resp: 18 18  Temp: 98.1  F (36.7 C) 98.4 F (36.9 C)  SpO2: 98% 98%     Intake/Output Summary (Last 24 hours) at 02/11/2018 0914 Last data filed at 02/10/2018 2344 Gross per 24 hour  Intake -  Output 400 ml  Net -400 ml    General:  Well appearing. No respiratory difficulty HEENT: normal Neck: supple. no JVD. Carotids 2+ bilat; no bruits. No lymphadenopathy or thryomegaly appreciated. Cor: PMI nondisplaced. Regular rate & rhythm. No rubs, gallops or murmurs. Lungs: clear Abdomen: soft, nontender, nondistended. No hepatosplenomegaly. No bruits or masses. Good bowel sounds. Extremities: no cyanosis, clubbing, rash, edema Neuro: alert & oriented x 3, cranial nerves grossly intact. moves all 4 extremities w/o difficulty. Affect pleasant.  ECG: in sinus tachycardia with the nonspecific ST-T changes and LVH and poor R-wave progression  Results for orders placed or performed during the hospital encounter of 02/10/18 (from the past 24 hour(s))  Basic metabolic panel     Status: Abnormal   Collection Time: 02/10/18 12:58  PM  Result Value Ref Range   Sodium 139 135 - 145 mmol/L   Potassium 3.2 (L) 3.5 - 5.1 mmol/L   Chloride 103 101 - 111 mmol/L   CO2 25 22 - 32 mmol/L   Glucose, Bld 125 (H) 65 - 99 mg/dL   BUN 12 6 - 20 mg/dL   Creatinine, Ser 1.61 0.61 - 1.24 mg/dL   Calcium 9.3 8.9 - 09.6 mg/dL   GFR calc non Af Amer >60 >60 mL/min   GFR calc Af Amer >60 >60 mL/min   Anion gap 11 5 - 15  CBC     Status: Abnormal   Collection Time: 02/10/18 12:58 PM  Result Value Ref Range   WBC 15.1 (H) 3.8 - 10.6 K/uL   RBC 6.10 (H) 4.40 - 5.90 MIL/uL   Hemoglobin 16.7 13.0 - 18.0 g/dL   HCT 04.5 40.9 - 81.1 %   MCV 80.5 80.0 - 100.0 fL   MCH 27.3 26.0 - 34.0 pg   MCHC 33.9 32.0 - 36.0 g/dL   RDW 91.4 (H) 78.2 - 95.6 %   Platelets 330 150 - 440 K/uL  Troponin I     Status: None   Collection Time: 02/10/18 12:58 PM  Result Value Ref Range   Troponin I <0.03 <0.03 ng/mL  TSH     Status: None   Collection Time: 02/10/18 11:28 PM  Result Value Ref Range   TSH 2.663 0.350 - 4.500 uIU/mL  Troponin I     Status: None   Collection Time: 02/10/18 11:28 PM  Result Value Ref Range   Troponin I <0.03 <0.03 ng/mL  Magnesium     Status: None   Collection Time: 02/10/18 11:28 PM  Result Value Ref Range   Magnesium 2.1 1.7 - 2.4 mg/dL  Urinalysis, Complete w Microscopic     Status: Abnormal   Collection Time: 02/10/18 11:43 PM  Result Value Ref Range   Color, Urine YELLOW (A) YELLOW   APPearance CLEAR (A) CLEAR   Specific Gravity, Urine 1.020 1.005 - 1.030   pH 5.0 5.0 - 8.0   Glucose, UA NEGATIVE NEGATIVE mg/dL   Hgb urine dipstick NEGATIVE NEGATIVE   Bilirubin Urine NEGATIVE NEGATIVE   Ketones, ur NEGATIVE NEGATIVE mg/dL   Protein, ur NEGATIVE NEGATIVE mg/dL   Nitrite NEGATIVE NEGATIVE   Leukocytes, UA NEGATIVE NEGATIVE   RBC / HPF 0-5 0 - 5 RBC/hpf   WBC, UA 0-5 0 - 5 WBC/hpf   Bacteria, UA  NONE SEEN NONE SEEN   Squamous Epithelial / LPF NONE SEEN 0 - 5   Mucus PRESENT    Hyaline Casts, UA PRESENT    Urine Drug Screen, Qualitative ([facility] only)     Status: None   Collection Time: 02/10/18 11:43 PM  Result Value Ref Range   Tricyclic, Ur Screen NONE DETECTED NONE DETECTED   Amphetamines, Ur Screen NONE DETECTED NONE DETECTED   MDMA (Ecstasy)Ur Screen NONE DETECTED NONE DETECTED   Cocaine Metabolite,Ur [place] NONE DETECTED NONE DETECTED   Opiate, Ur Screen NONE DETECTED NONE DETECTED   Phencyclidine (PCP) Ur S NONE DETECTED NONE DETECTED   Cannabinoid 50 Ng, Ur [place] NONE DETECTED NONE DETECTED   Barbiturates, Ur Screen NONE DETECTED NONE DETECTED   Benzodiazepine, Ur Scrn NONE DETECTED NONE DETECTED   Methadone Scn, Ur NONE DETECTED NONE DETECTED  Troponin I     Status: None   Collection Time: 02/11/18  4:42 AM  Result Value Ref Range   Troponin I <0.03 <0.03 ng/mL  Basic metabolic panel     Status: Abnormal   Collection Time: 02/11/18  4:42 AM  Result Value Ref Range   Sodium 142 135 - 145 mmol/L   Potassium 3.6 3.5 - 5.1 mmol/L   Chloride 106 101 - 111 mmol/L   CO2 27 22 - 32 mmol/L   Glucose, Bld 106 (H) 65 - 99 mg/dL   BUN 15 6 - 20 mg/dL   Creatinine, Ser 5.28 0.61 - 1.24 mg/dL   Calcium 9.1 8.9 - 41.3 mg/dL   GFR calc non Af Amer >60 >60 mL/min   GFR calc Af Amer >60 >60 mL/min   Anion gap 9 5 - 15  CBC with Differential/Platelet     Status: Abnormal   Collection Time: 02/11/18  4:42 AM  Result Value Ref Range   WBC 12.7 (H) 3.8 - 10.6 K/uL   RBC 5.71 4.40 - 5.90 MIL/uL   Hemoglobin 15.7 13.0 - 18.0 g/dL   HCT 24.4 01.0 - 27.2 %   MCV 81.2 80.0 - 100.0 fL   MCH 27.5 26.0 - 34.0 pg   MCHC 33.8 32.0 - 36.0 g/dL   RDW 53.6 (H) 64.4 - 03.4 %   Platelets 305 150 - 440 K/uL   Neutrophils Relative % 55 %   Neutro Abs 7.1 (H) 1.4 - 6.5 K/uL   Lymphocytes Relative 34 %   Lymphs Abs 4.3 (H) 1.0 - 3.6 K/uL   Monocytes Relative 7 %   Monocytes Absolute 0.8 0.2 - 1.0 K/uL   Eosinophils Relative 3 %   Eosinophils Absolute 0.3 0 - 0.7 K/uL   Basophils Relative 1 %   Basophils  Absolute 0.1 0 - 0.1 K/uL   Dg Chest Port 1 View  Result Date: 02/10/2018 CLINICAL DATA:  Acute onset of leukocytosis. EXAM: PORTABLE CHEST 1 VIEW COMPARISON:  None. FINDINGS: The lungs are well-aerated and clear. There is no evidence of focal opacification, pleural effusion or pneumothorax. The cardiomediastinal silhouette is within normal limits. No acute osseous abnormalities are seen. IMPRESSION: No acute cardiopulmonary process seen. Electronically Signed   By: Roanna Raider M.D.   On: 02/10/2018 22:23     ASSESSMENT AND PLAN: hypertensive crisis with blood pressure systolic between 742-595. Diastolic running between 120 and 150. Agree with adding labetalol and may consider adding hydralazine if blood pressure is not well controlled with labetalol also. Will get echocardiogram to further evaluate ejection fraction as well as LVH.  Osten Janek A

## 2018-02-11 NOTE — Progress Notes (Signed)
Dr Allena KatzPatel made aware of elevated BP post morning medications/ stated he will make adjustments to meds/ will continue to monitor.

## 2018-02-11 NOTE — Plan of Care (Signed)
B/P remains elevated but trending down.

## 2018-02-11 NOTE — Progress Notes (Signed)
*  PRELIMINARY RESULTS* Echocardiogram 2D Echocardiogram has been performed.  Joanette GulaJoan M Henri Baumler 02/11/2018, 10:45 AM

## 2018-02-11 NOTE — Plan of Care (Signed)
  Problem: Education: Goal: Knowledge of General Education information will improve Outcome: Progressing   Problem: Clinical Measurements: Goal: Diagnostic test results will improve Outcome: Progressing Goal: Cardiovascular complication will be avoided Outcome: Progressing   

## 2018-02-11 NOTE — Progress Notes (Signed)
BP is coming down nicely, may decrease dosage of labetalol or stop amlodipine in AM if BP any lower. May also consider stopping clonidine as is alpha blocker and  labetalol is also alpha blocker/beta blocker , and can have side effects.

## 2018-02-12 LAB — BASIC METABOLIC PANEL
Anion gap: 10 (ref 5–15)
BUN: 27 mg/dL — ABNORMAL HIGH (ref 6–20)
CALCIUM: 8.9 mg/dL (ref 8.9–10.3)
CO2: 26 mmol/L (ref 22–32)
Chloride: 102 mmol/L (ref 101–111)
Creatinine, Ser: 1.6 mg/dL — ABNORMAL HIGH (ref 0.61–1.24)
GFR calc Af Amer: >60 mL/min (ref >60)
GFR calc non Af Amer: 54 mL/min — ABNORMAL LOW (ref >60)
GLUCOSE: 96 mg/dL (ref 65–99)
POTASSIUM: 3 mmol/L — AB (ref 3.5–5.1)
Sodium: 138 mmol/L (ref 135–145)

## 2018-02-12 LAB — HIV ANTIBODY (ROUTINE TESTING W REFLEX): HIV Screen 4th Generation wRfx: NONREACTIVE

## 2018-02-12 MED ORDER — AMLODIPINE BESYLATE 10 MG PO TABS: 10.0000 mg | ORAL_TABLET | Freq: Every day | ORAL | 0 refills | Status: DC

## 2018-02-12 MED ORDER — HYDRALAZINE HCL 100 MG PO TABS: 100.0000 mg | ORAL_TABLET | Freq: Three times a day (TID) | ORAL | 11 refills | Status: AC

## 2018-02-12 MED ORDER — LABETALOL HCL 300 MG PO TABS: 300.0000 mg | ORAL_TABLET | Freq: Three times a day (TID) | ORAL | 0 refills | Status: DC

## 2018-02-12 MED ORDER — LORATADINE 10 MG PO TABS: 10.0000 mg | ORAL_TABLET | Freq: Every day | ORAL | 0 refills | Status: DC

## 2018-02-12 MED ORDER — POTASSIUM CHLORIDE CRYS ER 20 MEQ PO TBCR
40.0000 meq | EXTENDED_RELEASE_TABLET | Freq: Once | ORAL | Status: AC
Start: 2018-02-12 — End: 2018-02-12
  Administered 2018-02-12: 40 meq via ORAL
  Filled 2018-02-12: qty 2

## 2018-02-12 MED ORDER — AZITHROMYCIN 250 MG PO TABS: ORAL_TABLET | ORAL | 0 refills | Status: DC

## 2018-02-12 MED ORDER — FLUTICASONE PROPIONATE 50 MCG/ACT NA SUSP: 2.0000 | Freq: Every day | NASAL | 2 refills | Status: DC

## 2018-02-12 MED ORDER — POTASSIUM CHLORIDE CRYS ER 20 MEQ PO TBCR: 40.0000 meq | EXTENDED_RELEASE_TABLET | Freq: Once | ORAL | 0 refills | Status: DC

## 2018-02-12 NOTE — Progress Notes (Signed)
SUBJECTIVE: patient is feeling fine but the creatinine 1.1.6 and blood pressure has significantly improved.   Vitals:   02/11/18 1603 02/11/18 1922 02/12/18 0304 02/12/18 0736  BP: (!) 153/109 134/87 (!) 133/94 (!) 153/101  Pulse: 91 82 82 82  Resp: 18 18 18    Temp:  97.9 F (36.6 C) 98.3 F (36.8 C) 98.4 F (36.9 C)  TempSrc:  Oral Oral Oral  SpO2: 99% 97% 98% 99%  Weight:      Height:        Intake/Output Summary (Last 24 hours) at 02/12/2018 0915 Last data filed at 02/11/2018 1747 Gross per 24 hour  Intake 600 ml  Output 825 ml  Net -225 ml    LABS: Basic Metabolic Panel: Recent Labs    02/10/18 2328 02/11/18 0442 02/12/18 0407  NA  --  142 138  K  --  3.6 3.0*  CL  --  106 102  CO2  --  27 26  GLUCOSE  --  106* 96  BUN  --  15 27*  CREATININE  --  1.04 1.60*  CALCIUM  --  9.1 8.9  MG 2.1  --   --    Liver Function Tests: No results for input(s): AST, ALT, ALKPHOS, BILITOT, PROT, ALBUMIN in the last 72 hours. No results for input(s): LIPASE, AMYLASE in the last 72 hours. CBC: Recent Labs    02/10/18 1258 02/11/18 0442  WBC 15.1* 12.7*  NEUTROABS  --  7.1*  HGB 16.7 15.7  HCT 49.1 46.4  MCV 80.5 81.2  PLT 330 305   Cardiac Enzymes: Recent Labs    02/10/18 2328 02/11/18 0442 02/11/18 1045  TROPONINI <0.03 <0.03 <0.03   BNP: Invalid input(s): POCBNP D-Dimer: No results for input(s): DDIMER in the last 72 hours. Hemoglobin A1C: Recent Labs    02/11/18 1045  HGBA1C 5.5   Fasting Lipid Panel: Recent Labs    02/11/18 1045  CHOL 153  HDL 21*  LDLCALC 97  TRIG [id number]*  CHOLHDL 7.3   Thyroid Function Tests: Recent Labs    02/10/18 2328  TSH 2.663   Anemia Panel: No results for input(s): VITAMINB12, FOLATE, FERRITIN, TIBC, IRON, RETICCTPCT in the last 72 hours.   PHYSICAL EXAM General: Well developed, well nourished, in no acute distress HEENT:  Normocephalic and atramatic Neck:  No JVD.  Lungs: Clear bilaterally to auscultation and  percussion. Heart: HRRR . Normal S1 and S2 without gallops or murmurs.  Abdomen: Bowel sounds are positive, abdomen soft and non-tender  Msk:  Back normal, normal gait. Normal strength and tone for age. Extremities: No clubbing, cyanosis or edema.   Neuro: Alert and oriented X 3. Psych:  Good affect, responds appropriately  TELEMETRY: sinus rhythm  ASSESSMENT AND PLAN: hypertensive crisis with blood pressure as high as 250/140 currently blood pressure is much better with current medication. However creatinine has gone up from being normal to 1.6 and may have renal artery stenosis and will stop the ACE inhibitor as well as hydrochlorothiazide and instead add hydralazine 50 twice a day and stop clonidine. Advise continuing labetalol and Norvasc. Patient can be discharged with follow-up in the office Monday at 1:30 PM  Active Problems:   HTN (hypertension) with goal to be determined    Laurier NancyKHAN,[place] Hijazi A, MD, [facility] 02/12/2018 9:15 AM

## 2018-02-12 NOTE — Progress Notes (Signed)
Discharge instructions explained to pt/ verbalized an understanding/ iv and tele removed/ ok for pt to drive self home per MD/ will transport off unit via wheelchair.

## 2018-02-12 NOTE — Discharge Summary (Signed)
[facility] - [place] at [facility], [place] y.o., DOB [birth year], MRN [id number]. Admission date: 02/10/2018 Discharge Date 02/12/2018 Primary MD Maryella Shivers Admitting Physician Bertrum Sol, MD  Admission Diagnosis  Leukocytosis [phone] Hypertensive emergency, no CHF [I16.1] Sinusitis, unspecified chronicity, unspecified location [J32.9]  Discharge Diagnosis   Active Problems:  Acute hypertensive crisis Abnormal EKG on presentation due to #1 Hypokalemia Chronic allergic rhinitis     Hospital Course  Patient is a 37 year old was referred to the ED due to his blood pressure being elevated.  Patient states that he was seen by his primary care provider was reported to ED for extremely elevated blood pressure.  Patient's blood pressure was significantly elevated.  He was told that he has history of elevated blood pressure but was not on any medications.  Patient recently has been taking Sudafed.  Patient was admitted and his blood pressure was treated.  He was started on HCTZ and lisinopril and his renal function did increase so these were discontinued.  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  Patient will need further evaluation of renal artery stenosis, hyperaldosteronism and cushion syndrome.  Patient also was seen by cardiology due to some EKG changes his echo showed mild LVH.  Patient will follow-up with cardiology as outpatient.        Consults  cardiology  Significant Tests:  See full reports for all details     Dg Chest Port 1 View  Result Date: 02/10/2018 CLINICAL DATA:  Acute onset of leukocytosis. EXAM: PORTABLE CHEST 1 VIEW COMPARISON:  None. FINDINGS: The lungs are well-aerated and clear. There is no evidence of focal opacification, pleural effusion or pneumothorax. The cardiomediastinal silhouette is within normal limits. No acute osseous abnormalities  are seen. IMPRESSION: No acute cardiopulmonary process seen. Electronically Signed   By: Roanna Raider M.D.   On: 02/10/2018 22:23       Today   Subjective:   Michael Carr she is doing much better stable for discharge Objective:   Blood pressure (!) 153/101, pulse 82, temperature 98.4 F (36.9 C), temperature source Oral, resp. rate 18, height 6' (1.829 m), weight 113.9 kg (251 lb), SpO2 99 %.  .  Intake/Output Summary (Last 24 hours) at 02/12/2018 1238 Last data filed at 02/12/2018 0949 Gross per 24 hour  Intake 600 ml  Output 225 ml  Net 375 ml    Exam VITAL SIGNS: Blood pressure (!) 153/101, pulse 82, temperature 98.4 F (36.9 C), temperature source Oral, resp. rate 18, height 6' (1.829 m), weight 113.9 kg (251 lb), SpO2 99 %.  GENERAL:  37 y.o.-year-old patient lying in the bed with no acute distress.  EYES: Pupils equal, round, reactive to light and accommodation. No scleral icterus. Extraocular muscles intact.  HEENT: Head atraumatic, normocephalic. Oropharynx and nasopharynx clear.  NECK:  Supple, no jugular venous distention. No thyroid enlargement, no tenderness.  LUNGS: Normal breath sounds bilaterally, no wheezing, rales,rhonchi or crepitation. No use of accessory muscles of respiration.  CARDIOVASCULAR: S1, S2 normal. No murmurs, rubs, or gallops.  ABDOMEN: Soft, nontender, nondistended. Bowel sounds present. No organomegaly or mass.  EXTREMITIES: No pedal edema, cyanosis, or clubbing.  NEUROLOGIC: Cranial nerves II through XII are intact. Muscle strength 5/5 in all extremities. Sensation intact. Gait not checked.  PSYCHIATRIC: The patient is alert and oriented x 3.  SKIN: No obvious rash, lesion, or ulcer.   Data Review  CBC w Diff:  Lab Results  Component Value Date   WBC 12.7 (H) 02/11/2018   HGB 15.7 02/11/2018   HCT 46.4 02/11/2018   PLT 305 02/11/2018   LYMPHOPCT 34 02/11/2018   MONOPCT 7 02/11/2018   EOSPCT 3 02/11/2018   BASOPCT 1 02/11/2018    CMP:  Lab Results  Component Value Date   NA 138 02/12/2018   K 3.0 (L) 02/12/2018   CL 102 02/12/2018   CO2 26 02/12/2018   BUN 27 (H) 02/12/2018   CREATININE 1.60 (H) 02/12/2018  .  Micro Results No results found for this or any previous visit (from the past 240 hour(s)).      Code Status Orders  (From admission, onward)        Start     Ordered   02/10/18 2256  Full code  Continuous     02/10/18 2256    Code Status History    This patient has a current code status but no historical code status.          Follow-up Information    Trey Sailorsollak, Adriana M, PA-C On 02/17/2018.   Specialty:  Physician Assistant Why:  appointment @ 11am Contact information: [address] [place] [address] [phone]        Mady HaagensenLateef, Munsoor, MD Follow up in 6 day(s).   Specialty:  Internal Medicine Why:  as new patient with possible secondary hypertention Contact information: [address] [place] [facility] [place] [id number] [phone]        Laurier NancyKhan, Shaukat A, MD Follow up on 02/15/2018.   Specialty:  Cardiology Why:  1: 30 pm Contact information: [address] [place] [address] [phone]           Discharge Medications   Allergies as of 02/12/2018   No Known Allergies     Medication List    TAKE these medications   amLODipine 10 MG tablet Commonly known as:  NORVASC Take 1 tablet (10 mg total) by mouth daily. Start taking on:  02/13/2018   azithromycin 250 MG tablet Commonly known as:  ZITHROMAX Take one tablet x 4 days Start taking on:  02/13/2018   fluticasone 50 MCG/ACT nasal spray Commonly known as:  FLONASE Place 2 sprays into both nostrils daily. Start taking on:  02/13/2018   hydrALAZINE 100 MG tablet Commonly known as:  APRESOLINE Take 1 tablet (100 mg total) by mouth 3 (three) times daily.   labetalol 300 MG tablet Commonly known as:  NORMODYNE Take 1 tablet (300 mg total) by mouth 3 (three) times daily.   loratadine  10 MG tablet Commonly known as:  CLARITIN Take 1 tablet (10 mg total) by mouth daily. Start taking on:  02/13/2018   potassium chloride SA 20 MEQ tablet Commonly known as:  K-DUR,KLOR-CON Take 2 tablets (40 mEq total) by mouth once for 1 dose.          Total Time in preparing paper work, data evaluation and todays exam - 35 minutes  Auburn BilberryShreyang Burnett Lieber M.D on 02/12/2018 at 12:38 PM [facility]   Office  [phone]

## 2018-02-15 ENCOUNTER — Telehealth: Payer: Self-pay

## 2018-02-15 NOTE — Telephone Encounter (Signed)
Transition Care Management Follow-up Telephone Call  How have you been since you were released from the hospital? Pt states he is doing alright and that starting all the new medications have been an adjustment. Pt states his taste buds are off and he has dizziness every now and then, but it is not often and does not last over 2 minutes. Pt declined chest pain, SOB, fever, n/v/d.   Do you understand why you were in the hospital? yes  Do you have a copy of your discharge instructions Yes Do you understand the discharge instrcutions? yes  Where were you discharged to? Home  Do you have support at home? Yes    Items Reviewed:  Medications obtained Yes  Medications reviewed: Yes  Dietary changes reviewed: Low sodium heart healthy.  Home Health? N/A  DME ordered at discharge obtained? NA  Medical supplies: NA    Functional Questionnaire:   Activities of Daily Living (ADLs):   He states he is independent in all ADL. States they require assistance with the following: None  Any transportation issues/concerns?: no  Any patient concerns? yes, pt would like to know what caused the high BP.  Confirmed importance and date/time of follow-up visits scheduled with PCP: yes, 02/17/18 @ 11 AM with Osvaldo AngstAdriana Pollak.  Confirm appointment scheduled with specialist? Yes  Confirmed with patient if condition begins to worsen call PCP or If it's emergency go to the ER.

## 2018-02-15 NOTE — Telephone Encounter (Signed)
Noted, thank you

## 2018-02-17 ENCOUNTER — Ambulatory Visit (INDEPENDENT_AMBULATORY_CARE_PROVIDER_SITE_OTHER): Payer: PRIVATE HEALTH INSURANCE | Admitting: Physician Assistant

## 2018-02-17 ENCOUNTER — Encounter: Payer: Self-pay | Admitting: Physician Assistant

## 2018-02-17 VITALS — BP 150/110 | HR 76 | Temp 98.0°F | Resp 16 | Wt 253.0 lb

## 2018-02-17 DIAGNOSIS — I10 Essential (primary) hypertension: Secondary | ICD-10-CM | POA: Diagnosis not present

## 2018-02-17 DIAGNOSIS — Z09 Encounter for follow-up examination after completed treatment for conditions other than malignant neoplasm: Secondary | ICD-10-CM | POA: Diagnosis not present

## 2018-02-17 NOTE — Patient Instructions (Signed)

## 2018-02-17 NOTE — Progress Notes (Signed)
Patient: Michael Carr Male    DOB: 03/10/1981   37 y.o.   MRN: [id number] Visit Date: 02/18/2018  Today's Provider: Trey SailorsAdriana M Leovardo Thoman, PA-C   Chief Complaint  Patient presents with  . Hypertension   Subjective:    Michael Carr is a 37 y/o man presenting today for a hospital follow up. He presented to this clinic on 02/10/2018 with BP 240/180 and was directed to the emergency room. EKG shows LVH. Troponins negative. A1c normal. Cholesterol normal. A slight leukocytosis resolved. Other labwork unremarkable. CXR normal.   He was admitted on 02/10/2018 and discharged on 02/12/2018. Various blood pressure medications were used to lower his BP over his hospital stay. He had an AKI after acute blood pressure lowering and Lisinopril initiating. Lisinopril was discontinued. Upon discharge, his blood pressure medications included amlodipine 10 mg QD, hydralazine 100 mg tid, labetalol 300 mg TID. He was advised to follow up outpatient with Dr. Welton FlakesKhan who saw him in the hospital. He followed up with Dr. Welton FlakesKhan as an outpatient on Monday 02/15/2018 and reports his labetalol was changed to 600 mg BID. He also reports Dr. Welton FlakesKhan plans to get a stress test and an ultrasound. He does not remember what labs were ordered but reports Dr. Welton FlakesKhan told him his kidney function went back to normal.   Hypertension  This is a new problem. The problem has been waxing and waning since onset. The problem is uncontrolled. Pertinent negatives include no anxiety, blurred vision, chest pain, headaches, malaise/fatigue, neck pain, orthopnea, palpitations, peripheral edema, PND, shortness of breath or sweats. There are no associated agents to hypertension. There are no compliance problems.     No Known Allergies   Current Outpatient Medications:  .  amLODipine (NORVASC) 10 MG tablet, Take 1 tablet (10 mg total) by mouth daily., Disp: 30 tablet, Rfl: 0 .  fluticasone (FLONASE) 50 MCG/ACT nasal spray, Place 2 sprays into both nostrils  daily., Disp: 16 g, Rfl: 2 .  hydrALAZINE (APRESOLINE) 100 MG tablet, Take 1 tablet (100 mg total) by mouth 3 (three) times daily., Disp: 90 tablet, Rfl: 11 .  labetalol (NORMODYNE) 300 MG tablet, Take 2 tablets (600 mg total) by mouth 2 (two) times daily., Disp: 360 tablet, Rfl: 0 .  potassium chloride SA (K-DUR,KLOR-CON) 20 MEQ tablet, Take 2 tablets (40 mEq total) by mouth once for 1 dose. (Patient not taking: Reported on 02/15/2018), Disp: 2 tablet, Rfl: 0  Review of Systems  Constitutional: Negative.  Negative for malaise/fatigue.  Eyes: Negative for blurred vision.  Respiratory: Negative.  Negative for shortness of breath.   Cardiovascular: Negative.  Negative for chest pain, palpitations, orthopnea and PND.  Gastrointestinal: Negative.   Musculoskeletal: Negative.  Negative for neck pain.  Neurological: Positive for dizziness (Occasionally). Negative for light-headedness and headaches.   160/100  Social History   Tobacco Use  . Smoking status: Never Smoker  . Smokeless tobacco: Never Used  Substance Use Topics  . Alcohol use: Yes    Comment: socially   Objective:   BP (!) 150/110   Pulse 76   Temp 98 F (36.7 C) (Oral)   Resp 16   Wt 253 lb (114.8 kg)   BMI 34.31 kg/m  Vitals:   02/17/18 1112 02/18/18 1144  BP: (!) 190/122 (!) 150/110  Pulse: 76   Resp: 16   Temp: 98 F (36.7 C)   TempSrc: Oral   Weight: 253 lb (114.8 kg)  Physical Exam  Constitutional: He is oriented to person, place, and time. He appears well-developed and well-nourished.  Cardiovascular: Normal rate and regular rhythm.  Pulmonary/Chest: Effort normal and breath sounds normal.  Neurological: He is alert and oriented to person, place, and time.  Skin: Skin is warm and dry.  Psychiatric: He has a normal mood and affect. His behavior is normal.        Assessment & Plan:     1. Hospital discharge follow-up  Patient's blood pressure has come down but is still not controlled. He saw  his cardiologist on Monday who adjusted medications and this will need a little time to take effect. He also reports wildly fluctuating blood pressures. Says at one point his blood pressure was 110/55 and he felt dizzy. He feels dizzy after taking hydralazine. Requesting records from Dr. Welton Flakes. Continue medications.  I have reviewed all imaging, labwork, and discharge summary from recent hospitalization. I have reconciled his medication list with hospital discharge medications.  2. HTN (hypertension), malignant  Will refer to cardiology in [place] due to mutual preference. Follow up with cardiology.  - Ambulatory referral to Cardiology - labetalol (NORMODYNE) 300 MG tablet; Take 2 tablets (600 mg total) by mouth 2 (two) times daily.  Dispense: 360 tablet; Refill: 0  Return if symptoms worsen or fail to improve.  The entirety of the information documented in the History of Present Illness, Review of Systems and Physical Exam were personally obtained by me. Portions of this information were initially documented by Kavin Leech, CMA and reviewed by me for thoroughness and accuracy.        Trey Sailors, PA-C  [facility]

## 2018-02-18 MED ORDER — LABETALOL HCL 300 MG PO TABS: 600.0000 mg | ORAL_TABLET | Freq: Two times a day (BID) | ORAL | 0 refills | Status: DC

## 2018-03-03 ENCOUNTER — Ambulatory Visit (INDEPENDENT_AMBULATORY_CARE_PROVIDER_SITE_OTHER): Payer: PRIVATE HEALTH INSURANCE | Admitting: Internal Medicine

## 2018-03-03 ENCOUNTER — Other Ambulatory Visit
Admission: RE | Admit: 2018-03-03 | Discharge: 2018-03-03 | Disposition: A | Discharge status: Home / Self Care | Payer: PRIVATE HEALTH INSURANCE | Source: Ambulatory Visit | Attending: Internal Medicine | Admitting: Internal Medicine

## 2018-03-03 ENCOUNTER — Encounter: Payer: Self-pay | Admitting: Internal Medicine

## 2018-03-03 VITALS — BP 200/110 | HR 90 | Ht 72.0 in | Wt 254.0 lb

## 2018-03-03 DIAGNOSIS — R9431 Abnormal electrocardiogram [ECG] [EKG]: Secondary | ICD-10-CM | POA: Diagnosis not present

## 2018-03-03 DIAGNOSIS — I10 Essential (primary) hypertension: Secondary | ICD-10-CM | POA: Insufficient documentation

## 2018-03-03 LAB — BASIC METABOLIC PANEL
Anion gap: 9 (ref 5–15)
BUN: 12 mg/dL (ref 6–20)
CALCIUM: 9.1 mg/dL (ref 8.9–10.3)
CO2: 26 mmol/L (ref 22–32)
Chloride: 107 mmol/L (ref 98–111)
Creatinine, Ser: 0.98 mg/dL (ref 0.61–1.24)
GLUCOSE: 106 mg/dL — AB (ref 70–99)
Potassium: 3.7 mmol/L (ref 3.5–5.1)
Sodium: 142 mmol/L (ref 135–145)

## 2018-03-03 NOTE — Progress Notes (Signed)
New Outpatient Visit Date: 03/03/2018  Referring Provider: Trey Sailors, PA-C [address] [place], [place] [address]  Chief Complaint: Elevated blood pressure  HPI:  Michael Carr is a 37 y.o. male who is being seen today for the evaluation of hypertension at the request of Jodi Marble, Adriana M, PA-C. He has a history of hypertension.  He was evaluated by his PCP earlier this month due to sinus infection.  At that time, his systolic blood pressure was noted to be greater than 200, prompting referral to the emergency department.  There, he was found to have blood pressure of 240/157 as well as sinus tachycardia with a heart rate of 110.  Mild leukocytosis was also noted.  Given these findings, he was admitted and seen by Dr. Welton Flakes for assistance with blood pressure management.  He recommended using labetalol and hydralazine for blood pressure control.  Echocardiogram showed preserved LVEF with mild diastolic dysfunction and mild MR and TR.  Hospital course was complicated by acute kidney injury after initiation of lisinopril.  He was discharged on amlodipine, hydralazine, and labetalol.  He was seen in follow-up by Dr. Welton Flakes.  However, Michael Carr and his PCP would like a second opinion from our practice.  Today, Michael Carr reports feeling well.  He states that he was first diagnosed with high blood pressure a few years ago while living near [place].  He was on HCTZ for a while, which he tolerated well.  However, since moving back to [place], he has been off all medications and has not followed with a physician regularly to manage his hypertension.  He exercises regularly without any chest pain or shortness of breath.  He also denies palpitations, lightheadedness, orthopnea, and PND.  He notes fluctuating blood pressures at home with some readings as low as 105 systolic (associated lightheadedness when blood pressure is this  low).  --------------------------------------------------------------------------------------------------  Cardiovascular History & Procedures: Cardiovascular Problems:  Uncontrolled hypertension  Risk Factors:  Hypertension, family history, obesity, and male gender  Cath/PCI:  None  CV Surgery:  None  EP Procedures and Devices:  None  Non-Invasive Evaluation(s):  Echo (02/11/18): Personally reviewed.  LV is normal size with at least moderate LVH and hyperdynamic contraction (LVEF > 70%).  There is grade 2 diastolic dysfunction with elevated filling pressures.  RV is normal in size and function. No significant valvular abnormalities.  Recent CV Pertinent Labs: Lab Results  Component Value Date   CHOL 153 02/11/2018   HDL 21 (L) 02/11/2018   LDLCALC 97 02/11/2018   TRIG 177 (H) 02/11/2018   CHOLHDL 7.3 02/11/2018   K 3.0 (L) 02/12/2018   MG 2.1 02/10/2018   BUN 27 (H) 02/12/2018   CREATININE 1.60 (H) 02/12/2018    --------------------------------------------------------------------------------------------------  Past Medical History:  Diagnosis Date  . Allergy   . Hypertension    previously per patient. Does not take any medications    Past Surgical History:  Procedure Laterality Date  . TONSILLECTOMY    . TYMPANOSTOMY TUBE PLACEMENT    . WISDOM TOOTH EXTRACTION      Current Meds  Medication Sig  . amLODipine (NORVASC) 10 MG tablet Take 1 tablet (10 mg total) by mouth daily.  . hydrALAZINE (APRESOLINE) 100 MG tablet Take 1 tablet (100 mg total) by mouth 3 (three) times daily.  Marland Kitchen labetalol (NORMODYNE) 300 MG tablet Take 2 tablets (600 mg total) by mouth 2 (two) times daily.    Allergies: Patient has no known allergies.  Social History   Tobacco Use  . Smoking status: Never Smoker  . Smokeless tobacco: Never Used  Substance Use Topics  . Alcohol use: Yes    Comment: socially  . Drug use: Never    Family History  Problem Relation Age of Onset   . Heart disease Mother   . Lung cancer Father   . Hemachromatosis Father   . Drug abuse Brother   . Alcohol abuse Brother   . Heart attack Brother   . Epilepsy Brother   . Cancer Maternal Grandfather   . Alzheimer's disease Maternal Grandfather   . Hemachromatosis Paternal Grandfather   . Liver disease Paternal Grandfather     Review of Systems: A 12-system review of systems was performed and was negative except as noted in the HPI.  --------------------------------------------------------------------------------------------------  Physical Exam: BP (!) 200/110 (BP Location: Right Arm, Patient Position: Sitting, Cuff Size: Large)   Pulse 90   Ht 6' (1.829 m)   Wt 254 lb (115.2 kg)   BMI 34.45 kg/m   Repeat BP 176/100  General:  NAD. HEENT: No conjunctival pallor or scleral icterus. Moist mucous membranes. OP clear. Neck: Supple without lymphadenopathy, thyromegaly, JVD, or HJR. No carotid bruit. Lungs: Normal work of breathing. Clear to auscultation bilaterally without wheezes or crackles. Heart: Regular rate and rhythm without murmurs, rubs, or gallops. Non-displaced PMI. Abd: Bowel sounds present. Soft, NT/ND without hepatosplenomegaly Ext: No lower extremity edema. Radial, PT, and DP pulses are 2+ bilaterally Skin: Warm and dry without rash. Neuro: CNIII-XII intact. Strength and fine-touch sensation intact in upper and lower extremities bilaterally. Psych: Normal mood and affect.  EKG:  NSR with borderline LVH and lateral T-wave inversions that could represent abnormal repolarization or ischemia.  Possible LAE.  Mild QT prolongation (QTc 469 seconds).  Lab Results  Component Value Date   WBC 12.7 (H) 02/11/2018   HGB 15.7 02/11/2018   HCT 46.4 02/11/2018   MCV 81.2 02/11/2018   PLT 305 02/11/2018    Lab Results  Component Value Date   NA 138 02/12/2018   K 3.0 (L) 02/12/2018   CL 102 02/12/2018   CO2 26 02/12/2018   BUN 27 (H) 02/12/2018   CREATININE 1.60  (H) 02/12/2018   GLUCOSE 96 02/12/2018    Lab Results  Component Value Date   CHOL 153 02/11/2018   HDL 21 (L) 02/11/2018   LDLCALC 97 02/11/2018   TRIG 177 (H) 02/11/2018   CHOLHDL 7.3 02/11/2018     --------------------------------------------------------------------------------------------------  ASSESSMENT AND PLAN: Uncontrolled hypertension Blood pressure has been elevated for years but not regularly managed.  Currently, Michael Carr is on 3 antihypertensive medications with suboptimal control.  We have discussed primary and secondary prevention and have agreed to proceed with secondary hypertension workup, including renal artery Doppler, serum aldosterone/plasma renin activity, and 24 hour urine catecholamines/metanephrines.  Given recent AKI with ACEI, bilateral RAS is a consideration.  I will recheck a BMP today to reassess his renal function and electrolytes in anticipation of starting HCTZ or HCTZ/spironolactone tomorrow.  He should continue current doses of amlodipine, labetalol, and hydralazine, though long-term I would like to consolidate this to a more contemporary and less frequent regimen.  Abnormal EKG EKG shows borderline LVH with lateral ST/T changes.  I have reviewed Michael Carr echo, which shows at least moderate LVH with hyperdynamic LV contraction.  Given lack of chest pain and shortness of breath, including when doing cardio exercise, we have agreed to defer ischemia testing for  now.  Myocardial perfusion stress test or cardiac CTA may need to be pursued in the future, however, particularly given his family history of premature CAD, uncontrolled hypertension, and obesity.  Follow-up: Return to clinic in 6 weeks.  Yvonne Kendallhristopher Aithana Kushner, MD 03/03/2018 3:42 PM

## 2018-03-03 NOTE — Patient Instructions (Signed)
Medication Instructions:  Your physician recommends that you continue on your current medications as directed. Please refer to the Current Medication list given to you today.   Labwork: Your physician recommends that you return for lab work in: TODAY at the Medical mall. (BMET, Serum aldosterone/renin) - 24 hour urine collection. - [facility] to [facility]. You will check in at the front desk to the right as you walk into the atrium. Valet Parking is offered if needed.   Your physician recommends that you return for lab work in: 3 WEEKS FOR LAB WORK AND BLOOD PRESSURE CHECK.   Testing/Procedures: Your physician has requested that you have a renal artery duplex. During this test, an ultrasound is used to evaluate blood flow to the kidneys. Allow one hour for this exam. Do not eat after midnight the day before and avoid carbonated beverages. Take your medications as you usually do.    Follow-Up: Your physician recommends that you schedule a follow-up appointment in: 3 WEEKS FOR LAB WORK (BMET) AND BLOOD PRESSURE CHECK.  Your physician recommends that you schedule a follow-up appointment in: 6 WEEKS WITH DR END OR APP.    24-Hour Urine Collection How do I do a 24-hour urine collection?  When you get up in the morning, urinate in the toilet and flush. Write down the time. This will be your start time on the day of collection and your end time on the next morning.  From then on, collect all of your urine in the plastic jug that is given to you.  Stop collecting your urine 24 hours after you started.  If the plastic jug that is given to you already has liquid in it, that is okay. Do not throw out the liquid or rinse out the jug. Some tests need the liquid to be added to your urine.  Keep your plastic jug cool in an ice chest or keep it in the refrigerator during the test.  When 24 hours are over, bring your plastic jug to the clinic lab. Keep the jug cool in an ice chest  while you are bringing it to the lab. This information is not intended to replace advice given to you by your health care provider. Make sure you discuss any questions you have with your health care provider. Document Released: 11/21/2008 Document Revised: 04/28/2016 Document Reviewed: 01/18/2014 Elsevier Interactive Patient Education  [employer]2018 [employer] Inc.

## 2018-03-04 ENCOUNTER — Other Ambulatory Visit: Payer: Self-pay | Admitting: *Deleted

## 2018-03-04 ENCOUNTER — Encounter: Payer: Self-pay | Admitting: Internal Medicine

## 2018-03-04 DIAGNOSIS — R9431 Abnormal electrocardiogram [ECG] [EKG]: Secondary | ICD-10-CM | POA: Insufficient documentation

## 2018-03-04 MED ORDER — SPIRONOLACTONE-HCTZ 25-25 MG PO TABS: ORAL_TABLET | ORAL | 3 refills | Status: AC

## 2018-03-05 ENCOUNTER — Other Ambulatory Visit
Admission: RE | Admit: 2018-03-05 | Discharge: 2018-03-05 | Disposition: A | Discharge status: Home / Self Care | Payer: PRIVATE HEALTH INSURANCE | Source: Ambulatory Visit | Attending: Internal Medicine | Admitting: Internal Medicine

## 2018-03-05 DIAGNOSIS — I10 Essential (primary) hypertension: Secondary | ICD-10-CM | POA: Insufficient documentation

## 2018-03-06 LAB — ALDOSTERONE + RENIN ACTIVITY W/ RATIO
ALDO / PRA Ratio: >61.7 — ABNORMAL HIGH (ref 0.0–30.0)
ALDOSTERONE: 10.3 ng/dL (ref 0.0–30.0)
PRA LC/MS/MS: <0.167 ng/mL/hr — ABNORMAL LOW (ref 0.167–5.380)

## 2018-03-10 ENCOUNTER — Telehealth: Payer: Self-pay | Admitting: Physician Assistant

## 2018-03-10 ENCOUNTER — Encounter: Payer: Self-pay | Admitting: Internal Medicine

## 2018-03-10 LAB — METANEPHRINES, URINE, 24 HOUR
METANEPH TOTAL UR: 151 ug/L
Metanephrines, 24H Ur: 355 ug/24 hr — ABNORMAL HIGH (ref 45–290)
NORMETANEPHRINE 24H UR: 1123 ug/(24.h) — AB (ref 82–500)
Normetanephrine, Ur: 478 ug/L
Total Volume: 2350

## 2018-03-10 NOTE — Telephone Encounter (Signed)
Pt called wanting Michael Carr to call him back to give her an update on his blood pressure issues and finding and talk about up coming test that he is having done.   Pt's call back is [phone]  Thanks teri

## 2018-03-10 NOTE — Telephone Encounter (Signed)
Spoke with this patient personally about his blood pressure and status of testing. Advised him to keep follow up with Dr. Okey DupreEnd and follow any recommendations he has about his aldosterone level. Reassured him that most causes of excess aldosterone are benign and one of his medications is already working to reduce aldosterone.

## 2018-03-13 ENCOUNTER — Encounter: Payer: Self-pay | Admitting: Internal Medicine

## 2018-03-15 ENCOUNTER — Other Ambulatory Visit: Payer: Self-pay | Admitting: *Deleted

## 2018-03-15 ENCOUNTER — Encounter: Payer: Self-pay | Admitting: Internal Medicine

## 2018-03-15 DIAGNOSIS — I10 Essential (primary) hypertension: Secondary | ICD-10-CM

## 2018-03-15 MED ORDER — AMLODIPINE BESYLATE 10 MG PO TABS: 10.0000 mg | ORAL_TABLET | Freq: Every day | ORAL | 1 refills | Status: DC

## 2018-03-15 MED ORDER — LABETALOL HCL 300 MG PO TABS: 600.0000 mg | ORAL_TABLET | Freq: Two times a day (BID) | ORAL | 1 refills | Status: AC

## 2018-03-17 ENCOUNTER — Ambulatory Visit (INDEPENDENT_AMBULATORY_CARE_PROVIDER_SITE_OTHER): Payer: PRIVATE HEALTH INSURANCE

## 2018-03-17 ENCOUNTER — Ambulatory Visit (INDEPENDENT_AMBULATORY_CARE_PROVIDER_SITE_OTHER): Payer: PRIVATE HEALTH INSURANCE | Admitting: *Deleted

## 2018-03-17 ENCOUNTER — Other Ambulatory Visit (INDEPENDENT_AMBULATORY_CARE_PROVIDER_SITE_OTHER): Payer: PRIVATE HEALTH INSURANCE | Admitting: *Deleted

## 2018-03-17 VITALS — BP 135/94 | HR 81 | Ht 72.0 in | Wt 251.0 lb

## 2018-03-17 DIAGNOSIS — I10 Essential (primary) hypertension: Secondary | ICD-10-CM

## 2018-03-17 NOTE — Progress Notes (Signed)
1.) Reason for visit: BP check and lab work  2.) Name of MD requesting visit: Dr End  3.) H&P: HTN  4.) ROS related to problem: Patient went without amlodipine and labetalol for 3 days (Friday through Sunday) and did not get the refills until Monday. Started them back on Monday. Denies any shortness of breath, chest pain, dizziness, blurred vision, or swelling. BP today 135/94, HR 81.  Patient states his average BP 149/79 over the past 4 days. States his BP has been as low 103/50 and higher end 201/99 (this was on the days he was out of his medication). He says he does feel dizzy when his BP is running this low. The initial dizziness comes within 30 minutes of taking his amlodipine. He noticed when he was out of the amlodipine he did not have the dizziness. Once he is up and about throughout the day, he does not have the dizziness anymore. Notes he has had two bowel movements with bright red flecks. Advised patient to f/u with PCP regarding this. Though patient thinks it is from his BP medications.   5.) Assessment and plan per MD: Patient advised to continue current medications at this time and will let him know what Dr Serita KyleEnd's recommendations are once he reviews record, labs, and renal dopplers. Patient verbalized understanding. Routing to Dr End for review.

## 2018-03-18 ENCOUNTER — Telehealth: Payer: Self-pay | Admitting: Internal Medicine

## 2018-03-18 DIAGNOSIS — I10 Essential (primary) hypertension: Secondary | ICD-10-CM

## 2018-03-18 LAB — BASIC METABOLIC PANEL
BUN / CREAT RATIO: 11 (ref 9–20)
BUN: 12 mg/dL (ref 6–20)
CO2: 24 mmol/L (ref 20–29)
Calcium: 9 mg/dL (ref 8.7–10.2)
Chloride: 102 mmol/L (ref 96–106)
Creatinine, Ser: 1.13 mg/dL (ref 0.76–1.27)
GFR calc non Af Amer: 83 mL/min/{1.73_m2} (ref >59)
GFR, EST AFRICAN AMERICAN: 95 mL/min/{1.73_m2} (ref >59)
Glucose: 121 mg/dL — ABNORMAL HIGH (ref 65–99)
POTASSIUM: 3.7 mmol/L (ref 3.5–5.2)
Sodium: 143 mmol/L (ref 134–144)

## 2018-03-18 MED ORDER — AMLODIPINE BESYLATE 5 MG PO TABS: 5.0000 mg | ORAL_TABLET | Freq: Every day | ORAL | 3 refills | Status: AC

## 2018-03-18 NOTE — Telephone Encounter (Signed)
The follow results and recommendations called to patient:     Please let Mr. Michael Carr know that his renal artery Doppler shows no evidence of stenosis. His renal function and electrolytes remain normal after recent addition of HCTZ-spironolactone. Given intermittent low blood pressure readings at home, I suggest that he decrease amlodipine to 5 mg daily and continue to watch his blood pressure closely. In light of abnormal serum aldosterone:plasma renin activity ratio and mildly elevated metanephrines, I suggest that we refer him to endocrinology at a tertiary care center for further evaluation [facility] or [facility] based on patient preference). He should follow-up with [name], PA, early next month, as previously scheduled.     Patient verbalized understanding of the above results and recommendations. Rx for amlodipine sent to pharmacy. [facility]. Patient was very appreciative.  Called [facility] referral line. Advised to fax records, demographics and insurance information to [phone]. Then they will reach out to patient to schedule the appointment.

## 2018-03-18 NOTE — Progress Notes (Signed)
Please let Mr. Michael Carr know that his renal artery Doppler shows no evidence of stenosis. His renal function and electrolytes remain normal after recent addition of HCTZ-spironolactone. Given intermittent low blood pressure readings at home, I suggest that he decrease amlodipine to 5 mg daily and continue to watch his blood pressure closely. In light of abnormal serum aldosterone:plasma renin activity ratio and mildly elevated metanephrines, I suggest that we refer him to endocrinology at a tertiary care center for further evaluation [facility] or [facility] based on patient preference). He should follow-up with [name], PA, early next month, as previously scheduled.

## 2018-03-18 NOTE — Progress Notes (Signed)
Patient notified. See telephone note. 

## 2018-03-18 NOTE — Telephone Encounter (Signed)
Required documentation faxed to [facility].

## 2018-03-18 NOTE — Telephone Encounter (Signed)
[facility] about test results from yesterday for a renal us and blood work

## 2018-03-22 ENCOUNTER — Encounter (INDEPENDENT_AMBULATORY_CARE_PROVIDER_SITE_OTHER): Payer: Self-pay

## 2018-03-22 ENCOUNTER — Telehealth: Payer: Self-pay | Admitting: Internal Medicine

## 2018-03-22 NOTE — Telephone Encounter (Signed)
Patients has a [facility] providers [facility] and wants to know if Dr. Okey DupreEnd would recommend someone specific .  Please send a mychart msg response.

## 2018-03-22 NOTE — Telephone Encounter (Signed)
I do not know any of the endocrinologists at [facility].  Yvonne Kendallhristopher Drena Ham, MD [facility] Pager: [phone]

## 2018-03-22 NOTE — Telephone Encounter (Signed)
MyChart message sent to patient.

## 2018-03-24 ENCOUNTER — Other Ambulatory Visit: Payer: PRIVATE HEALTH INSURANCE

## 2018-03-24 ENCOUNTER — Ambulatory Visit: Payer: PRIVATE HEALTH INSURANCE

## 2018-04-12 ENCOUNTER — Telehealth: Payer: Self-pay | Admitting: Physician Assistant

## 2018-04-12 NOTE — Telephone Encounter (Signed)
Pt is having problems getting an appt with an endocrinologist  He can not find anyone in his insurance net work.  He is waiting on to find out if the last doctor that was recommended is in his network.   He just wanted to let you know.   Pt's contact number is [phone]  Thanks Barth Kirksteri

## 2018-04-13 ENCOUNTER — Ambulatory Visit: Payer: PRIVATE HEALTH INSURANCE | Admitting: Physician Assistant

## 2018-04-13 ENCOUNTER — Encounter: Payer: Self-pay | Admitting: Internal Medicine

## 2018-04-13 NOTE — Telephone Encounter (Signed)
It looks like [facility] has only been [facility]. Has he considered seeing [facility] [place]? I do not know if they are in his network, but that would be another option for him. Let me know and I can place referral.

## 2018-04-13 NOTE — Telephone Encounter (Signed)
lmtcb

## 2018-09-04 MED ORDER — AMLODIPINE BESYLATE 10 MG PO TABS
10.00 | ORAL_TABLET | ORAL | Status: DC
Start: 2018-09-05 — End: 2018-09-04

## 2018-09-04 MED ORDER — MELATONIN 3 MG PO TABS
3.00 | ORAL_TABLET | ORAL | Status: DC
Start: ? — End: 2018-09-04

## 2018-09-04 MED ORDER — HYDROMORPHONE HCL 1 MG/ML IJ SOLN
0.50 | INTRAMUSCULAR | Status: DC
Start: ? — End: 2018-09-04

## 2018-09-04 MED ORDER — ACETAMINOPHEN 500 MG PO TABS
1000.00 | ORAL_TABLET | ORAL | Status: DC
Start: 2018-09-04 — End: 2018-09-04

## 2018-09-04 MED ORDER — KETOROLAC TROMETHAMINE 15 MG/ML IJ SOLN
15.00 | INTRAMUSCULAR | Status: DC
Start: 2018-09-04 — End: 2018-09-04

## 2018-09-04 MED ORDER — ONDANSETRON HCL 4 MG/2ML IJ SOLN
4.00 | INTRAMUSCULAR | Status: DC
Start: ? — End: 2018-09-04

## 2018-09-04 MED ORDER — HYDROCHLOROTHIAZIDE 12.5 MG PO TABS
12.50 | ORAL_TABLET | ORAL | Status: DC
Start: 2018-09-05 — End: 2018-09-04

## 2018-09-04 MED ORDER — GENERIC EXTERNAL MEDICATION
5.00 | Status: DC
Start: ? — End: 2018-09-04

## 2018-09-04 MED ORDER — HEPARIN SODIUM (PORCINE) 5000 UNIT/ML IJ SOLN
5000.00 | INTRAMUSCULAR | Status: DC
Start: 2018-09-04 — End: 2018-09-04

## 2019-07-30 IMAGING — DX DG CHEST 1V PORT
2 series · 2 of 2 positions shown · non-contrast
Comparison: None.

CLINICAL DATA: Acute onset of leukocytosis.

EXAM:
PORTABLE CHEST 1 VIEW

[chest ap (1 of 2)]
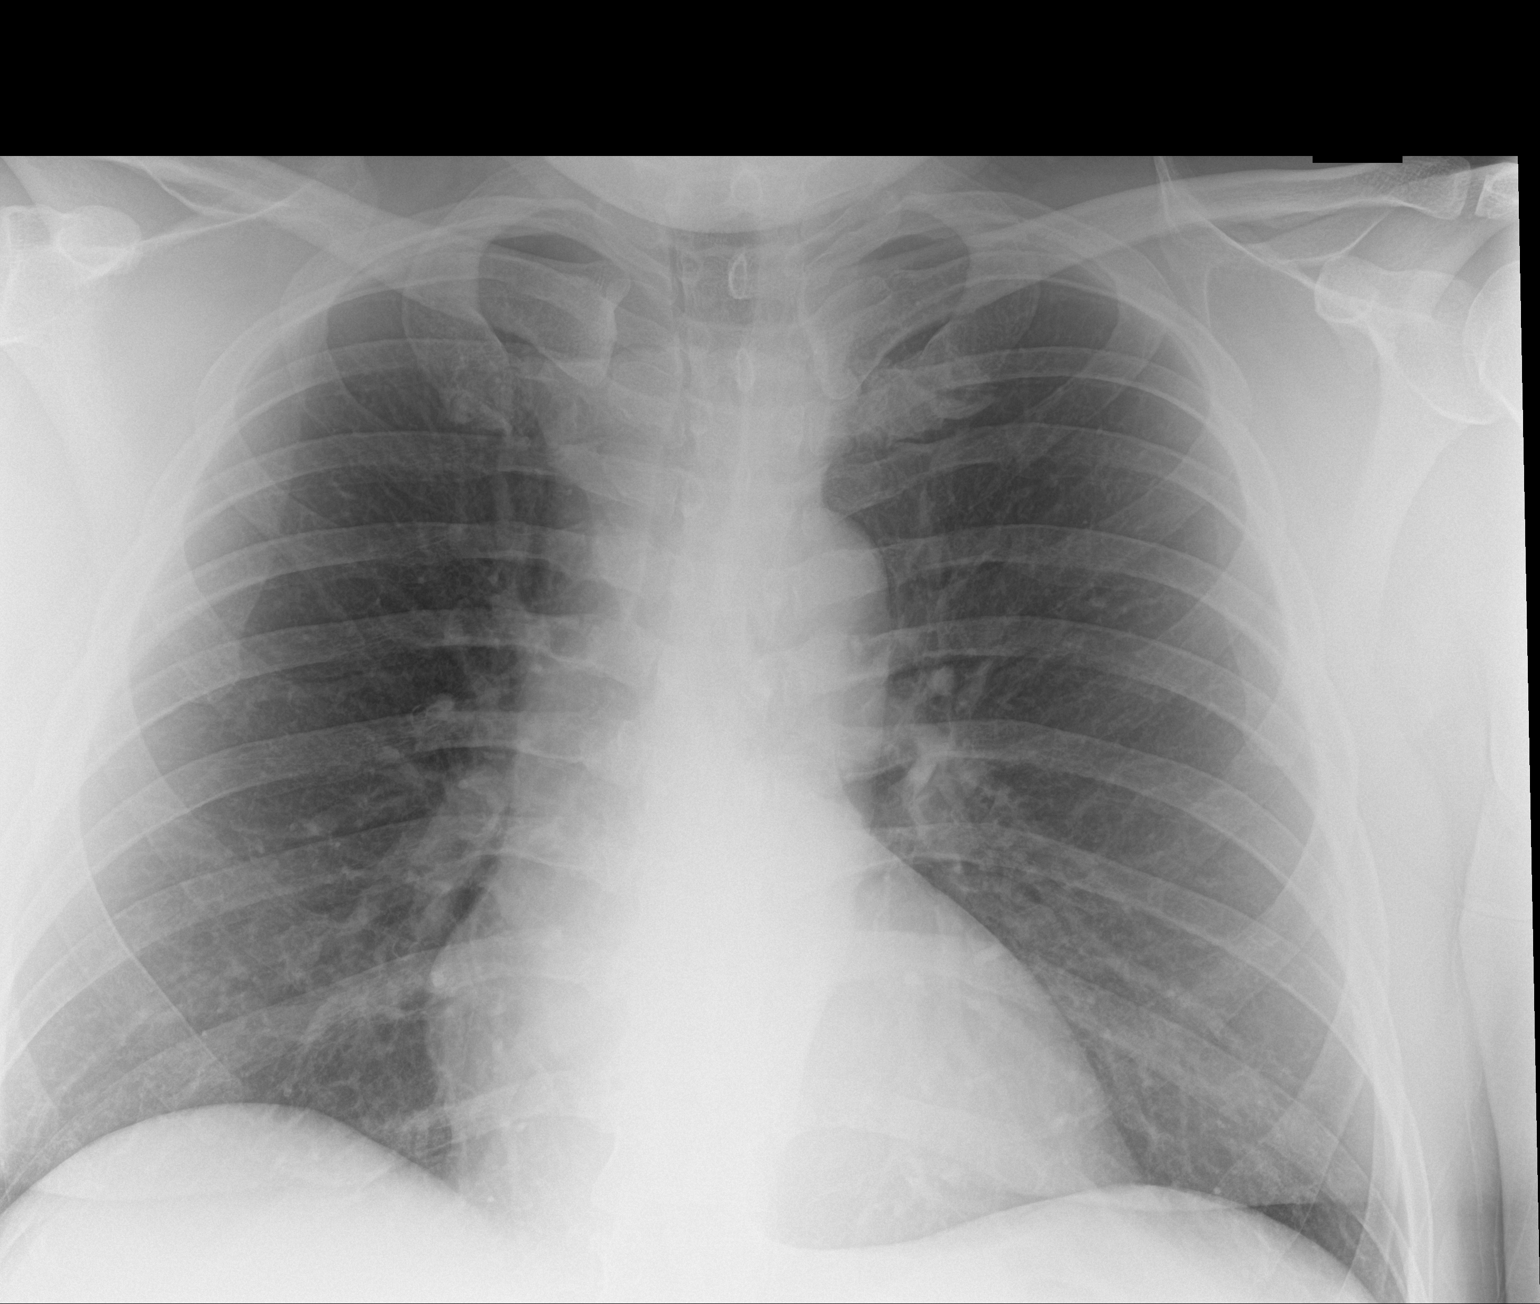

[chest ap (2 of 2)]
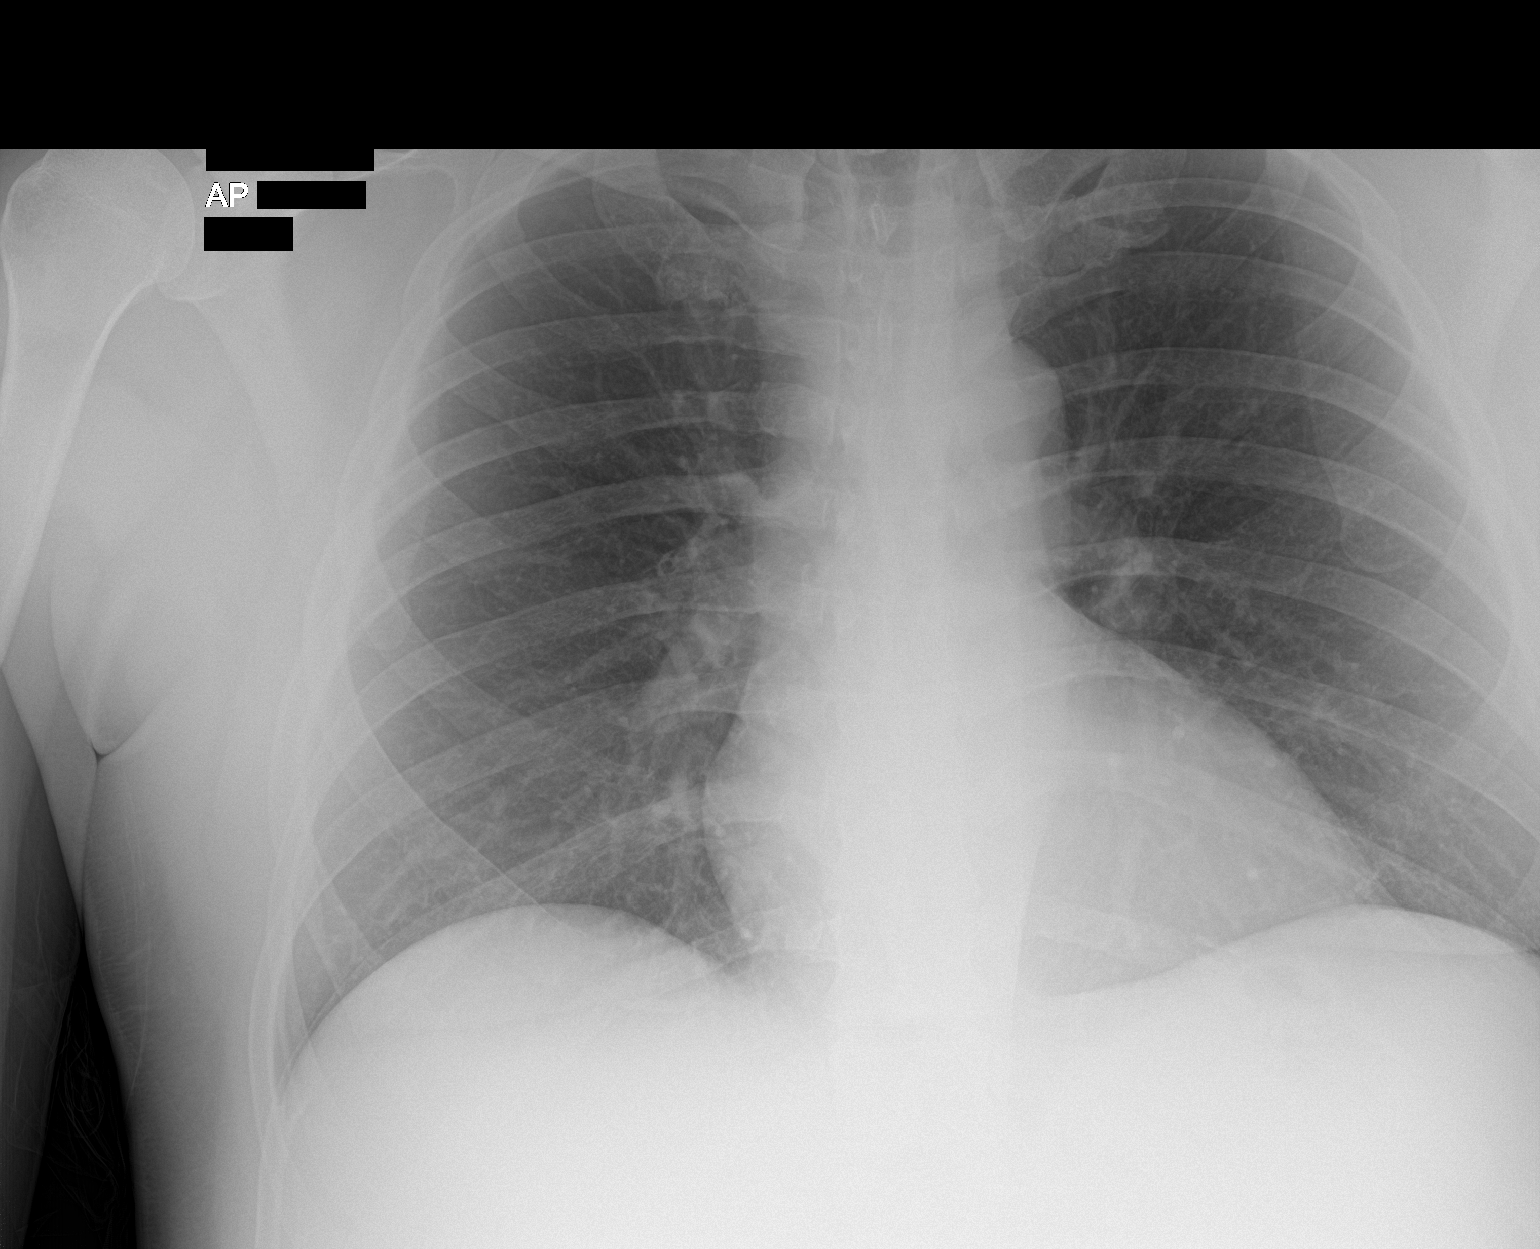

[2 of 2 positions shown; findings below may reference images not displayed]

FINDINGS: The lungs are well-aerated and clear. There is no evidence of focal
opacification, pleural effusion or pneumothorax.

The cardiomediastinal silhouette is within normal limits. No acute
osseous abnormalities are seen.
IMPRESSION: No acute cardiopulmonary process seen.
